# Patient Record
Sex: Female | Born: 1985 | Race: White | Hispanic: No | Smoking: Current every day smoker
Health system: Southern US, Community
[De-identification: ages and names within clinical notes are randomized; demographics above are authoritative.]

## PROBLEM LIST (undated history)

## (undated) DIAGNOSIS — I10 Essential (primary) hypertension: Secondary | ICD-10-CM

## (undated) DIAGNOSIS — L719 Rosacea, unspecified: Secondary | ICD-10-CM

## (undated) DIAGNOSIS — M419 Scoliosis, unspecified: Secondary | ICD-10-CM

## (undated) DIAGNOSIS — F329 Major depressive disorder, single episode, unspecified: Secondary | ICD-10-CM

## (undated) DIAGNOSIS — K210 Gastro-esophageal reflux disease with esophagitis: Secondary | ICD-10-CM

## (undated) DIAGNOSIS — F32A Depression, unspecified: Secondary | ICD-10-CM

## (undated) DIAGNOSIS — A048 Other specified bacterial intestinal infections: Secondary | ICD-10-CM

## (undated) DIAGNOSIS — F419 Anxiety disorder, unspecified: Secondary | ICD-10-CM

## (undated) HISTORY — DX: Other specified bacterial intestinal infections: A04.8

## (undated) HISTORY — DX: Gastro-esophageal reflux disease with esophagitis: K21.0

## (undated) HISTORY — DX: Scoliosis, unspecified: M41.9

## (undated) HISTORY — DX: Rosacea, unspecified: L71.9

## (undated) HISTORY — DX: Essential (primary) hypertension: I10

## (undated) HISTORY — DX: Anxiety disorder, unspecified: F41.9

## (undated) HISTORY — DX: Depression, unspecified: F32.A

## (undated) HISTORY — DX: Major depressive disorder, single episode, unspecified: F32.9

---

## 2004-09-06 DIAGNOSIS — K2100 Gastro-esophageal reflux disease with esophagitis, without bleeding: Secondary | ICD-10-CM

## 2004-09-06 HISTORY — DX: Gastro-esophageal reflux disease with esophagitis, without bleeding: K21.00

## 2007-11-08 DIAGNOSIS — I10 Essential (primary) hypertension: Secondary | ICD-10-CM

## 2007-11-08 HISTORY — DX: Essential (primary) hypertension: I10

## 2008-01-05 ENCOUNTER — Emergency Department (HOSPITAL_COMMUNITY): Admission: EM | Admit: 2008-01-05 | Discharge: 2008-01-05 | Payer: Self-pay | Admitting: Emergency Medicine

## 2008-04-21 ENCOUNTER — Emergency Department (HOSPITAL_COMMUNITY): Admission: EM | Admit: 2008-04-21 | Discharge: 2008-04-21 | Payer: Self-pay | Admitting: Family Medicine

## 2008-05-31 ENCOUNTER — Emergency Department (HOSPITAL_COMMUNITY): Admission: EM | Admit: 2008-05-31 | Discharge: 2008-05-31 | Payer: Self-pay | Admitting: Emergency Medicine

## 2008-06-09 ENCOUNTER — Ambulatory Visit: Payer: Self-pay | Admitting: Internal Medicine

## 2008-06-09 LAB — CONVERTED CEMR LAB
Basophils Absolute: 0 10*3/uL (ref 0.0–0.1)
CO2: 29 meq/L (ref 19–32)
Chloride: 109 meq/L (ref 96–112)
Glucose, Bld: 94 mg/dL (ref 70–99)
Hemoglobin: 13.7 g/dL (ref 12.0–15.0)
Lymphocytes Relative: 30 % (ref 12.0–46.0)
MCHC: 35 g/dL (ref 30.0–36.0)
Monocytes Relative: 2.6 % — ABNORMAL LOW (ref 3.0–12.0)
Neutrophils Relative %: 66.2 % (ref 43.0–77.0)
Platelets: 224 10*3/uL (ref 150–400)
Potassium: 4.6 meq/L (ref 3.5–5.1)
RDW: 11.7 % (ref 11.5–14.6)
Sodium: 141 meq/L (ref 135–145)
TSH: 2.2 microintl units/mL (ref 0.35–5.50)

## 2008-06-24 ENCOUNTER — Encounter: Payer: Self-pay | Admitting: Internal Medicine

## 2008-06-24 ENCOUNTER — Ambulatory Visit: Payer: Self-pay

## 2008-06-24 ENCOUNTER — Ambulatory Visit: Payer: Self-pay | Admitting: Internal Medicine

## 2009-02-17 ENCOUNTER — Ambulatory Visit: Payer: Self-pay | Admitting: Family Medicine

## 2009-02-17 DIAGNOSIS — R05 Cough: Secondary | ICD-10-CM | POA: Insufficient documentation

## 2009-07-11 ENCOUNTER — Telehealth (INDEPENDENT_AMBULATORY_CARE_PROVIDER_SITE_OTHER): Payer: Self-pay | Admitting: *Deleted

## 2010-01-16 ENCOUNTER — Inpatient Hospital Stay (HOSPITAL_COMMUNITY): Admission: AD | Admit: 2010-01-16 | Discharge: 2010-01-19 | Payer: Self-pay | Admitting: Obstetrics and Gynecology

## 2010-03-14 ENCOUNTER — Encounter: Payer: Self-pay | Admitting: Family Medicine

## 2010-11-06 NOTE — Miscellaneous (Signed)
Summary: PATIENT SUMMARY  Clinical Lists Changes      I've never met patient. apparently receives xanax, but never prescribed by [facility].

## 2010-11-28 ENCOUNTER — Encounter: Payer: Self-pay | Admitting: *Deleted

## 2010-12-26 LAB — CBC
HCT: 31.3 % — ABNORMAL LOW (ref 36.0–46.0)
HCT: 33.9 % — ABNORMAL LOW (ref 36.0–46.0)
Hemoglobin: 10.6 g/dL — ABNORMAL LOW (ref 12.0–15.0)
MCHC: 34 g/dL (ref 30.0–36.0)
MCV: 90.3 fL (ref 78.0–100.0)
RBC: 3.43 MIL/uL — ABNORMAL LOW (ref 3.87–5.11)
RBC: 3.75 MIL/uL — ABNORMAL LOW (ref 3.87–5.11)
RDW: 14.6 % (ref 11.5–15.5)
WBC: 8.3 10*3/uL (ref 4.0–10.5)

## 2011-02-19 NOTE — Assessment & Plan Note (Signed)
[facility]                            CARDIOLOGY OFFICE NOTE   JANAVIA, ROTTMAN                        MRN:          [id number]  DATE:06/09/2008                            DOB:          August 28, 1986    IDENTIFICATION:  [name] is a 25 year old woman who was referred for  evaluation of rapid heartbeat.   HISTORY OF PRESENT ILLNESS:  The patient has had a history of  palpitations over the past year.  She notes it can occur with and  without activity, but worse with activity.  She will get a little dizzy  at times, but no syncope.  She has been noted in physical exams to have  rapid heartbeat.  She was seen at [facility] and sent  here for further evaluation.  Again, they appeared to have occurred  earlier this year.  Blood work was reportedly done, though I do not have  the results on this.  EKG was also done.  TSH was reportedly done that  was normal.  She was told to avoid caffeine, trial of Xanax was given.  The patient has noted continuing symptoms.   The patient also has a history of hypertension that was diagnosed  earlier this year.  She is now on verapamil 120, still with symptoms.  Note, blood pressure also noted to be mildly elevated on outpatient  studies.  Her blood pressure has been up in the past even before  starting oral contraceptives by her report.   ALLERGIES:  No known drug allergies.   MEDICATIONS:  1. Verapamil 120.  2. Amitriptyline 50 nightly.  3. Spiriva daily.  4. Xanax 0.25 p.r.n.  5. Multivitamin.   PAST MEDICAL HISTORY:  1. Hypertension.  2. Palpitations.   SURGICAL HISTORY:  Negative.   FAMILY HISTORY:  Mother with a history of anxiety, also history of  hypertension runs in her family.  No premature CAD.   SOCIAL HISTORY:  The patient is married, smokes 4 cigarettes per day,  does not drink alcohol.   REVIEW OF SYSTEMS:  Bitemporal headaches, occasional ankle swelling.  Otherwise all  systems reviewed negative to the above problem except as  noted above.   PHYSICAL EXAM:  GENERAL:  The patient is in no distress.  VITAL SIGNS:  On arrival, blood pressure 141/95, pulse is 105.  Lying  blood pressure 122/83, pulse 86, sitting 131/88, pulse 93, standing  148/94, pulse 92, at 2 minutes 140/88, pulse 99, at 5 minutes 143/84,  pulse 97.  The patient asymptomatic throughout.  HEENT:  Normocephalic and atraumatic.  EOMI.  PERRL.  Mucous membranes  are moist.  NECK:  JVP is normal.  No thyromegaly or bruits.  LUNGS:  Clear to auscultation throughout.  No rales.  No wheezes.  CARDIAC:  Regular rate and rhythm.  S1 and S2.  No S3, S4, or murmurs.  ABDOMEN:  Supple, nontender.  No hepatomegaly.  EXTREMITIES:  Good distal pulses, equal onset, no lower extremity edema.   A 12-lead EKG shows normal sinus tachycardia, rate at 105 beats per  minute.  IMPRESSION:  [name] is a 25 year old with palpitations.  They do not  sound too hemodynamically disturbing.  She was recommended to cut out  caffeine.  She is currently on verapamil and she is still having  symptoms.   1. I would recommend checking a TSH.  Again, we could not get that      from the other office, also a CBC and a BMET.  Also set her up for      Holter to evaluate her 24-hour rate control.  2. Hypertension, not optimally controlled today.  With her desire      within the next year or 2 of getting pregnant, I think switching      her over to a drug that could be used throughout pregnancy would be      good.  She is running out of the verapamil, yesterday was her last      dose.  I would recommend starting labetalol 200 b.i.d.  3. Health care maintenance.  I have counseled her extensively on      smoking.  I think this can exacerbate the problem plus with her on      oral contraceptives, I do not think it is good idea with the      increased risk of clotting again in the setting particularly of       hypertension.   I will be in touch with the patient's physician.  With the use of  Mircette, again there is increased risk for hypertension, question if  she could use another form of contraception.  She said, however, that  her blood pressure was high even before starting the contraceptive.  I  will look to verify.   I will set to see the patient back in a few weeks.  Again, I will be in  touch with the test results.     Pricilla Riffle, MD, [facility]  Electronically Signed    PVR/MedQ  DD: 06/09/2008  DT: 06/10/2008  Job #: [id number]

## 2011-02-22 NOTE — Letter (Signed)
June 27, 2008    Verdie Drown, NP  [address]  [place], [place] [address]   RE:  Suzanne Black, Suzanne Black  MRN:  [id number]  /  DOB:  12/04/1985   Dear Ms. Slatosky:   I appreciate the referral.  I saw her back on June 09, 2008, and I  have enclosed my clinic note with this letter.   When I saw her, I set her up for blood work, Holter monitor.  These have  come back and are unremarkable.  Echocardiogram was also unremarkable.   When I saw her, blood pressure was not optimally controlled.  I switched  her to a beta-blocker with her thoughts of potential pregnancy  (labetalol 200 b.i.d.).  I have set to see her back in the clinic in a  couple of weeks.   My concern is her use of contraceptives, Suzanne Black, as this may be  exacerbating her blood pressure.  I did not take her off it, but told  her I would be talking to you.  Also, of concern to me is the fact that  she does smoke (though only 4 cigarettes per day).  This is increasing  her risk for thrombotic event.   I appreciate your input.  I will also keep you up-to-date with her  clinic returns.   Thank you again for allowing me to participate in her care.  You can  contact me at my beeper [phone] or through our office [phone].    Sincerely,      Pricilla Riffle, MD, [facility]  Electronically Signed    PVR/MedQ  DD: 06/28/2008  DT: 06/29/2008  Job #: [id number]

## 2011-02-28 ENCOUNTER — Ambulatory Visit (INDEPENDENT_AMBULATORY_CARE_PROVIDER_SITE_OTHER): Payer: 59

## 2011-02-28 ENCOUNTER — Inpatient Hospital Stay (INDEPENDENT_AMBULATORY_CARE_PROVIDER_SITE_OTHER)
Admission: RE | Admit: 2011-02-28 | Discharge: 2011-02-28 | Disposition: A | Discharge status: Home / Self Care | Payer: 59 | Source: Ambulatory Visit | Attending: Family Medicine | Admitting: Family Medicine

## 2011-02-28 DIAGNOSIS — J40 Bronchitis, not specified as acute or chronic: Secondary | ICD-10-CM

## 2011-03-15 ENCOUNTER — Ambulatory Visit: Payer: 59 | Admitting: Family Medicine

## 2011-07-01 LAB — URINALYSIS, ROUTINE W REFLEX MICROSCOPIC
Glucose, UA: NEGATIVE
Ketones, ur: NEGATIVE
Leukocytes, UA: NEGATIVE
Nitrite: NEGATIVE
Specific Gravity, Urine: 1.026
pH: 6

## 2011-07-01 LAB — CBC
Hemoglobin: 14.2
MCHC: 34.2
MCV: 86.4
RBC: 4.81
RDW: 12.6

## 2011-07-01 LAB — COMPREHENSIVE METABOLIC PANEL
ALT: 9
CO2: 22
Calcium: 9.3
Creatinine, Ser: 1.17
GFR calc Af Amer: >60
GFR calc non Af Amer: 58 — ABNORMAL LOW
Glucose, Bld: 112 — ABNORMAL HIGH
Sodium: 139
Total Protein: 6.8

## 2011-07-01 LAB — URINE MICROSCOPIC-ADD ON

## 2011-07-01 LAB — DIFFERENTIAL
Lymphocytes Relative: 45
Lymphs Abs: 3.4
Monocytes Relative: 6
Neutro Abs: 3.6
Neutrophils Relative %: 47

## 2011-07-01 LAB — LIPASE, BLOOD: Lipase: 30

## 2011-07-05 LAB — POCT URINALYSIS DIP (DEVICE)
Bilirubin Urine: NEGATIVE
Glucose, UA: NEGATIVE
Hgb urine dipstick: NEGATIVE
Ketones, ur: NEGATIVE
Nitrite: POSITIVE — AB

## 2011-07-05 LAB — URINE CULTURE: Colony Count: >=100000

## 2011-07-05 LAB — POCT PREGNANCY, URINE: Preg Test, Ur: NEGATIVE

## 2011-07-12 ENCOUNTER — Other Ambulatory Visit: Payer: Self-pay | Admitting: Family Medicine

## 2011-07-12 ENCOUNTER — Ambulatory Visit (INDEPENDENT_AMBULATORY_CARE_PROVIDER_SITE_OTHER): Payer: 59 | Admitting: Family Medicine

## 2011-07-12 ENCOUNTER — Encounter: Payer: Self-pay | Admitting: Family Medicine

## 2011-07-12 VITALS — BP 118/88 | HR 80 | Temp 98.2°F | Wt 176.0 lb

## 2011-07-12 DIAGNOSIS — R12 Heartburn: Secondary | ICD-10-CM

## 2011-07-12 MED ORDER — OMEPRAZOLE 40 MG PO CPDR: 40.0000 mg | DELAYED_RELEASE_CAPSULE | Freq: Every day | ORAL | Status: DC

## 2011-07-15 ENCOUNTER — Encounter: Payer: Self-pay | Admitting: Family Medicine

## 2011-07-15 NOTE — Progress Notes (Signed)
Suzanne Black has had heartburn, and nausea and occasional vomiting for 2 weeks. She notes that these symptoms are similar to H. Pylori that he had as a teenager. She notes some pain in epigastric region following eating. She has tried intermittent H2 blockers but this does not help much. She denies any diarrhea or URQ pain. No fever or chills. No melena or blood per rectum.   PMH reviewed.  ROS as above otherwise neg Medications reviewed.  Exam:  BP 118/88  Pulse 80  Temp(Src) 98.2 F (36.8 C) (Oral)  Wt 176 lb (79.833 kg)  LMP 06/28/2011 Gen: Well NAD Lungs: CTABL Nl WOB Heart: RRR no MRG Abd: NABS, NT, ND. Neg Murphy sign and no masses palpated.  Exts: Non edematous BL  LE, warm and well perfused.

## 2011-07-15 NOTE — Assessment & Plan Note (Signed)
Not sure of etiology however reflux is most likely.  As she has a past history of H. Pylori I cannot rely on an antibody blood test. She will be able to do a Urease Breath Test today for conformation.  Will also start on Omeprazole 40mg  daily x 1 month. Will follow up in my clinic.  If breath test positive I will use quadruple therapy. If negative will follow up and obtain blood and likely order US abdomen.

## 2011-07-17 ENCOUNTER — Telehealth: Payer: Self-pay | Admitting: Family Medicine

## 2011-07-17 NOTE — Telephone Encounter (Signed)
Pt returned a miss call and is not sure who it was from.  It came from this practice.

## 2011-07-17 NOTE — Telephone Encounter (Signed)
Pt advised U/S sched for Friday at 8:00 at [facility]. Pt advised NPO after midnight. Pt sched BW for 8:45 Friday after U/S.

## 2011-07-17 NOTE — Telephone Encounter (Signed)
Pt is asking to know what to do about her [place] appt- where does she go? Any prep for this?

## 2011-07-17 NOTE — Telephone Encounter (Signed)
Called to discuss test results. Omeprazole has not helped Plan to place order for Sheliah to walk in for a CMP and CBC. We'll also order abdominal ultrasound.

## 2011-07-18 ENCOUNTER — Telehealth: Payer: Self-pay | Admitting: Family Medicine

## 2011-07-18 ENCOUNTER — Ambulatory Visit (INDEPENDENT_AMBULATORY_CARE_PROVIDER_SITE_OTHER): Payer: 59 | Admitting: Family Medicine

## 2011-07-18 VITALS — BP 131/82 | HR 90 | Temp 98.5°F | Ht 64.75 in

## 2011-07-18 DIAGNOSIS — R1013 Epigastric pain: Secondary | ICD-10-CM

## 2011-07-18 DIAGNOSIS — R12 Heartburn: Secondary | ICD-10-CM

## 2011-07-18 DIAGNOSIS — R109 Unspecified abdominal pain: Secondary | ICD-10-CM

## 2011-07-18 LAB — COMPREHENSIVE METABOLIC PANEL
ALT: <8 U/L (ref 0–35)
Alkaline Phosphatase: 85 U/L (ref 39–117)
CO2: 20 mEq/L (ref 19–32)
Creat: 0.98 mg/dL (ref 0.50–1.10)
Glucose, Bld: 85 mg/dL (ref 70–99)
Total Bilirubin: 0.3 mg/dL (ref 0.3–1.2)

## 2011-07-18 LAB — CBC
HCT: 41 % (ref 36.0–46.0)
MCV: 86.1 fL (ref 78.0–100.0)
RBC: 4.76 MIL/uL (ref 3.87–5.11)
WBC: 6.8 10*3/uL (ref 4.0–10.5)

## 2011-07-18 MED ORDER — ESOMEPRAZOLE MAGNESIUM 40 MG PO CPDR: 40.0000 mg | DELAYED_RELEASE_CAPSULE | Freq: Every day | ORAL | Status: DC

## 2011-07-18 NOTE — Telephone Encounter (Signed)
There is now a hard place on her belly and is really swollen.  Got sick on her stomach and wonders if she should have something done today.

## 2011-07-18 NOTE — Telephone Encounter (Signed)
Spoke with patient and advised her to come in now and we would work her in with Dr Denyse Amass.Yohan Samons, Rodena Medin

## 2011-07-18 NOTE — Progress Notes (Signed)
  Subjective:    Patient ID: Suzanne Black, female    DOB: 1986-09-04, 25 y.o.   MRN: [id number]  HPI 1.  Abdominal pain:  Comes in today with continue abdominal pain.  Has been ongoing for 2-3 weeks.  Has seen Dr. Denyse Amass recently for this and started on PPI.  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  She has been having black-tarry bowel movements but this is not with every bowel movement.  She often feels nauseated after eating as well.  She denies BRBPR.  She has felt more tired lately, but denies dizziness or palpitations.  She denies fever, chills, hematemesis.   Of note she does have an ultrasound scheduled for tomorrow morning ordered by Dr. Denyse Amass.     Review of Systems     Objective:   Physical Exam  Constitutional: She appears well-developed and well-nourished. No distress.  HENT:  Head: Normocephalic and atraumatic.  Cardiovascular: Normal rate, regular rhythm and normal heart sounds.   No murmur heard. Pulmonary/Chest: Effort normal and breath sounds normal. No respiratory distress.  Abdominal: Soft. Normal appearance and bowel sounds are normal. There is tenderness in the epigastric area. There is no rebound, no guarding, no tenderness at McBurney's point and negative Murphy's sign.    Genitourinary: Rectum normal. Rectal exam shows no external hemorrhoid, no internal hemorrhoid, no mass and no tenderness. Guaiac negative stool.          Assessment & Plan:

## 2011-07-18 NOTE — Patient Instructions (Addendum)
I think your pain is likely from gastritis vs. Peptic ulcer disease.  Although you didn't have blood in your stool today, it does not mean you do not have an intermittent bleed.  We will go ahead and check your blood count and chemistry panel today.  Go ahead with your ultrasound tomorrow as well.  I would like for you to take your nexium twice per day for the next two weeks.  If you are not improving within the next 1-2 weeks please give [place] a call back.

## 2011-07-19 ENCOUNTER — Ambulatory Visit (HOSPITAL_COMMUNITY)
Admission: RE | Admit: 2011-07-19 | Discharge: 2011-07-19 | Disposition: A | Discharge status: Home / Self Care | Payer: 59 | Source: Ambulatory Visit | Attending: Family Medicine | Admitting: Family Medicine

## 2011-07-19 ENCOUNTER — Other Ambulatory Visit: Payer: 59

## 2011-07-19 DIAGNOSIS — R109 Unspecified abdominal pain: Secondary | ICD-10-CM | POA: Insufficient documentation

## 2011-07-21 DIAGNOSIS — R1013 Epigastric pain: Secondary | ICD-10-CM | POA: Insufficient documentation

## 2011-07-21 NOTE — Assessment & Plan Note (Signed)
Persistent epigastric pain and report of black-tarry stool concerning for ulcer or gastritis.  She is hemeocculst negative today but this does not rule out an intermittent bleed.  Will have her go ahead with U/S tomorrow to assess for any other pathology.  For now will have her double up on her PPI to see if this helps with her symptoms.  If not improving may need referral to GI for EGD.   Will check CMET and CBC today.

## 2011-07-26 ENCOUNTER — Encounter: Payer: Self-pay | Admitting: Family Medicine

## 2011-08-15 ENCOUNTER — Other Ambulatory Visit: Payer: Self-pay | Admitting: Family Medicine

## 2011-08-15 ENCOUNTER — Ambulatory Visit (HOSPITAL_COMMUNITY)
Admission: RE | Admit: 2011-08-15 | Discharge: 2011-08-15 | Disposition: A | Discharge status: Home / Self Care | Payer: 59 | Source: Ambulatory Visit | Attending: Sports Medicine | Admitting: Sports Medicine

## 2011-08-15 ENCOUNTER — Ambulatory Visit (INDEPENDENT_AMBULATORY_CARE_PROVIDER_SITE_OTHER): Payer: 59 | Admitting: Sports Medicine

## 2011-08-15 VITALS — BP 120/80 | Ht 65.0 in | Wt 172.0 lb

## 2011-08-15 DIAGNOSIS — M549 Dorsalgia, unspecified: Secondary | ICD-10-CM | POA: Insufficient documentation

## 2011-08-15 DIAGNOSIS — M25562 Pain in left knee: Secondary | ICD-10-CM | POA: Insufficient documentation

## 2011-08-15 DIAGNOSIS — M2170 Unequal limb length (acquired), unspecified site: Secondary | ICD-10-CM

## 2011-08-15 DIAGNOSIS — M546 Pain in thoracic spine: Secondary | ICD-10-CM

## 2011-08-15 DIAGNOSIS — M4120 Other idiopathic scoliosis, site unspecified: Secondary | ICD-10-CM | POA: Insufficient documentation

## 2011-08-15 DIAGNOSIS — M25569 Pain in unspecified knee: Secondary | ICD-10-CM

## 2011-08-15 DIAGNOSIS — M25561 Pain in right knee: Secondary | ICD-10-CM

## 2011-08-15 MED ORDER — TRAMADOL HCL 50 MG PO TABS: 50.0000 mg | ORAL_TABLET | Freq: Four times a day (QID) | ORAL | Status: DC | PRN

## 2011-08-15 NOTE — Progress Notes (Signed)
  Subjective:    Patient ID: Suzanne Black, female    DOB: 11/21/1985, 25 y.o.   MRN: [id number]  HPI 25 yo [facility]. She complains of worsening bilateral knee pain x 4 days following a long day of standing on her feet at work. She has a history of low back, hip and knee pain for which she was evaluated in 2005 to rule out autoimmune arthritis. Her work-up was negative. At baseline she has 3-4/10 pain in her low back and knees. She now has 6-7/10 pain in her knees that radiates down into her into legs and calves. She feels that her knees are swollen R>L.   Physical activity: on her feet at work. Averages 2-3 days of exercise (walk/run on treadmill for 1-1.5 miles).   Fam Hx: negative for arthritis/ rheumatological disease  Review of Systems She denies fever, chills, weight loss. She admits to fatigue but is busy with a 107 month old daughter.     Objective:   Physical Exam Gen: NAD, overweight female.  Back: thoracic kyphosis. Thoracic and lumbar pain with lateral flexion and extension. No spinal tenderness.   Bilateral Knee: Normal to inspection with no erythema or effusion or obvious bony abnormalities. Palpation normal with no warmth or joint line tenderness or patellar tenderness or condyle tenderness. ROM normal in flexion and extension and lower leg rotation. Ligaments with solid consistent endpoints including ACL, PCL, LCL, MCL. Negative Mcmurray's and provocative meniscal tests. Non painful patellar compression. Patellar and quadriceps tendons unremarkable. Hamstring and quadriceps strength is normal.  Pelvis/Hips: Non tender. Full ROM. Negative log roll. Negative Faber's bilaterally. Negative log roll bilaterally.  Lower extremities: LLE 1.5 cm longer than RLE. Negative Lachman test bilaterally.  Gait/Stance: Genu valgus on the Left. Bilateral ankle laxity.     Assessment & Plan:

## 2011-08-15 NOTE — Assessment & Plan Note (Addendum)
A: Pain secondary to exaggerated lordosis. Lordosis is concerning for osteopenia vs. Scheuermann's disease.  P: Obtain 2 view thoracic radiographs to evaluate thoracic vertebrae. Continue ibuprofen prn. F/u pending review of x-rays.   X-rays reveal that she does have increased lordosis but also has a degrees of thoracic scoliosis. I suspect this trigger some symptoms from nerve retraction and muscle spasm. We will give her exercises to emphasize lessening the thoracic lordosis and for maintaining good posture. We will recheck this in 6 weeks

## 2011-08-15 NOTE — Assessment & Plan Note (Signed)
I think most of his symptoms in the knees are triggered by standing in her leg length inequality. This does change her gait so we will make additional adjustments to her shoes and inserts.

## 2011-08-15 NOTE — Patient Instructions (Addendum)
  Please do the postural exercises and the upper back strengthening exercises on a daily basis.  You are given some exercises for her knee and hamstring strength to  Try the lift in her shoe if this works well we will to add list to your other shoes.  We should recheck you in about 6 weeks.

## 2011-08-15 NOTE — Assessment & Plan Note (Signed)
A: low back and bilateral knee pain secondary to compensation for unequal leg length.  P: correct length discrepancy with orthotic inserts. Patient to f/u after obtaining thoracic x-ray for initiation of basic exercise regimen for midback and quad/hamstring strengthening.

## 2012-01-11 ENCOUNTER — Emergency Department
Admission: EM | Admit: 2012-01-11 | Discharge: 2012-01-11 | Disposition: A | Discharge status: Home / Self Care | Payer: 59 | Source: Home / Self Care | Attending: Family Medicine | Admitting: Family Medicine

## 2012-01-11 DIAGNOSIS — J029 Acute pharyngitis, unspecified: Secondary | ICD-10-CM

## 2012-01-11 HISTORY — DX: Essential (primary) hypertension: I10

## 2012-01-11 NOTE — Discharge Instructions (Signed)
Pharyngitis, Viral and Bacterial Pharyngitis is soreness (inflammation) or infection of the pharynx. It is also called a sore throat. CAUSES  Most sore throats are caused by viruses and are part of a cold. However, some sore throats are caused by strep and other bacteria. Sore throats can also be caused by post nasal drip from draining sinuses, allergies and sometimes from sleeping with an open mouth. Infectious sore throats can be spread from person to person by coughing, sneezing and sharing cups or eating utensils. TREATMENT  Sore throats that are viral usually last 3-4 days. Viral illness will get better without medications (antibiotics). Strep throat and other bacterial infections will usually begin to get better about 24-48 hours after you begin to take antibiotics. HOME CARE INSTRUCTIONS   If the caregiver feels there is a bacterial infection or if there is a positive strep test, they will prescribe an antibiotic. The full course of antibiotics must be taken. If the full course of antibiotic is not taken, you or your child may become ill again. If you or your child has strep throat and do not finish all of the medication, serious heart or kidney diseases may develop.   Drink enough water and fluids to keep your urine clear or pale yellow.   Only take over-the-counter or prescription medicines for pain, discomfort or fever as directed by your caregiver.   Get lots of rest.   Gargle with salt water ( tsp. of salt in a glass of water) as often as every 1-2 hours as you need for comfort.   Hard candies may soothe the throat if individual is not at risk for choking. Throat sprays or lozenges may also be used.  SEEK MEDICAL CARE IF:   Large, tender lumps in the neck develop.   A rash develops.   Green, yellow-brown or bloody sputum is coughed up.   Your baby is older than 3 months with a rectal temperature of 100.5 F (38.1 C) or higher for more than 1 day.  SEEK IMMEDIATE MEDICAL CARE  IF:   A stiff neck develops.   You or your child are drooling or unable to swallow liquids.   You or your child are vomiting, unable to keep medications or liquids down.   You or your child has severe pain, unrelieved with recommended medications.   You or your child are having difficulty breathing (not due to stuffy nose).   You or your child are unable to fully open your mouth.   You or your child develop redness, swelling, or severe pain anywhere on the neck.   You have a fever.   Your baby is older than 3 months with a rectal temperature of 102 F (38.9 C) or higher.   Your baby is 36 months old or younger with a rectal temperature of 100.4 F (38 C) or higher.  MAKE SURE YOU:   Understand these instructions.   Will watch your condition.   Will get help right away if you are not doing well or get worse.  Document Released: 09/23/2005 Document Revised: 09/12/2011 Document Reviewed: 12/21/2007 [facility] Patient Information 2012 [place], [place].     Rapid Step negative.  Viral pharyngitis likely, increase fluids, tylenol or motrin for discomfort.  OTC Throat spray of your choice.

## 2012-01-11 NOTE — ED Provider Notes (Signed)
History     CSN: [id number]  Arrival date & time 01/11/12  1013   First MD Initiated Contact with Patient 01/11/12 1050      Chief Complaint  Patient presents with  . Sore Throat  . Fatigue    (Consider location/radiation/quality/duration/timing/severity/associated sxs/prior treatment) HPI Comments: See notes by Lannie Fields NP  See notes by Lannie Fields NP Past Medical History  Diagnosis Date  . H. pylori infection   . Hypertension     History reviewed. No pertinent past surgical history.  Family History  Problem Relation Age of Onset  . Hypertension Mother     History  Substance Use Topics  . Smoking status: Current Everyday Smoker -- 0.3 packs/day    Types: Cigarettes  . Smokeless tobacco: Not on file  . Alcohol Use: No    OB History    Grav Para Term Preterm Abortions TAB SAB Ect Mult Living                  Review of Systems See notes by Lannie Fields NP Allergies  Review of patient's allergies indicates no known allergies.  Home Medications   Current Outpatient Rx  Name Route Sig Dispense Refill  . DESOGESTREL-ETHINYL ESTRADIOL 0.15-0.02/0.01 MG (21/5) PO TABS Oral Take 1 tablet by mouth daily.    Marland Kitchen ALPRAZOLAM 0.25 MG PO TABS Oral Take 0.25 mg by mouth as needed.      Marland Kitchen CITALOPRAM HYDROBROMIDE 40 MG PO TABS Oral Take 40 mg by mouth daily.      Marland Kitchen ESOMEPRAZOLE MAGNESIUM 40 MG PO CPDR Oral Take 1 capsule (40 mg total) by mouth daily before breakfast. 60 capsule 2  . FLUTICASONE PROPIONATE 50 MCG/ACT NA SUSP Nasal 2 sprays by Nasal route daily. Per nostril     . LABETALOL HCL 200 MG PO TABS Oral Take 200 mg by mouth 2 (two) times daily.      . TRAMADOL HCL 50 MG PO TABS Oral Take 1 tablet (50 mg total) by mouth every 6 (six) hours as needed for pain. Maximum dose= 8 tablets per day 60 tablet 2    BP 106/72  Pulse 89  Temp(Src) 97.7 F (36.5 C) (Oral)  Resp 18  Ht 5\' 4"  (1.626 m)  Wt 181 lb 8 oz (82.328 kg)  BMI 31.15 kg/m2  SpO2 96%  LMP  01/04/2012  Physical Exam See notes by Lannie Fields Np ED Course  Procedures (including critical care time)   Labs Reviewed  POCT RAPID STREP A (OFFICE)   No results found.   1. Acute pharyngitis   2. Sore throat       MDM  See notes by Lannie Fields, NP        Lattie Haw, MD 01/12/12 [phone]

## 2012-01-11 NOTE — ED Provider Notes (Signed)
History     CSN: [id number]  Arrival date & time 01/11/12  1013   First MD Initiated Contact with Patient 01/11/12 1050      Chief Complaint  Patient presents with  . Sore Throat  . Fatigue    (Consider location/radiation/quality/duration/timing/severity/associated sxs/prior treatment) Patient is a 26 y.o. female presenting with pharyngitis.  Sore Throat  Suzanne Black is a 26 y.o. female who complains of onset of sore throat for 4 days.  + cough, non productive No pleuritic pain No wheezing No nasal congestion + post-nasal drainage + sinus pain/pressure No chest congestion No itchy/red eyes No earache No hemoptysis No SOB No chills/sweats No fever No nausea No vomiting No abdominal pain No diarrhea No skin rashes No fatigue No myalgias + headache  Daughter diagnosed and treated with antibiotics for strep (on 8th day).    Past Medical History  Diagnosis Date  . H. pylori infection   . Hypertension     History reviewed. No pertinent past surgical history.  Family History  Problem Relation Age of Onset  . Hypertension Mother     History  Substance Use Topics  . Smoking status: Current Everyday Smoker -- 0.3 packs/day    Types: Cigarettes  . Smokeless tobacco: Not on file  . Alcohol Use: No    OB History    Grav Para Term Preterm Abortions TAB SAB Ect Mult Living                  Review of Systems  All other systems reviewed and are negative.    Allergies  Review of patient's allergies indicates no known allergies.  Home Medications   Current Outpatient Rx  Name Route Sig Dispense Refill  . DESOGESTREL-ETHINYL ESTRADIOL 0.15-0.02/0.01 MG (21/5) PO TABS Oral Take 1 tablet by mouth daily.    Marland Kitchen ALPRAZOLAM 0.25 MG PO TABS Oral Take 0.25 mg by mouth as needed.      Marland Kitchen CITALOPRAM HYDROBROMIDE 40 MG PO TABS Oral Take 40 mg by mouth daily.      Marland Kitchen ESOMEPRAZOLE MAGNESIUM 40 MG PO CPDR Oral Take 1 capsule (40 mg total) by mouth daily before breakfast. 60  capsule 2  . FLUTICASONE PROPIONATE 50 MCG/ACT NA SUSP Nasal 2 sprays by Nasal route daily. Per nostril     . LABETALOL HCL 200 MG PO TABS Oral Take 200 mg by mouth 2 (two) times daily.      . TRAMADOL HCL 50 MG PO TABS Oral Take 1 tablet (50 mg total) by mouth every 6 (six) hours as needed for pain. Maximum dose= 8 tablets per day 60 tablet 2    BP 106/72  Pulse 89  Temp(Src) 97.7 F (36.5 C) (Oral)  Resp 18  Ht 5\' 4"  (1.626 m)  Wt 181 lb 8 oz (82.328 kg)  BMI 31.15 kg/m2  SpO2 96%  LMP 01/04/2012  Physical Exam  Constitutional: She is oriented to person, place, and time. Vital signs are normal. She appears well-developed and well-nourished. She is active and cooperative.  HENT:  Head: Normocephalic.  Right Ear: Hearing, tympanic membrane, external ear and ear canal normal.  Left Ear: Hearing, tympanic membrane, external ear and ear canal normal.  Nose: Nose normal.  Mouth/Throat: Uvula is midline and mucous membranes are normal. Posterior oropharyngeal erythema present.  Eyes: Conjunctivae are normal. Pupils are equal, round, and reactive to light. No scleral icterus.  Neck: Trachea normal. Neck supple.  Cardiovascular: Normal rate and regular rhythm.   Pulmonary/Chest: Effort  normal and breath sounds normal.  Lymphadenopathy:    She has cervical adenopathy.       Left cervical: Posterior cervical adenopathy present.  Neurological: She is alert and oriented to person, place, and time. No cranial nerve deficit or sensory deficit.  Skin: Skin is warm and dry.  Psychiatric: She has a normal mood and affect. Her speech is normal and behavior is normal. Judgment and thought content normal. Cognition and memory are normal.    ED Course  Procedures (including critical care time)   Labs Reviewed  POCT RAPID STREP A (OFFICE)   No results found.   1. Acute pharyngitis   2. Sore throat       MDM  OTC throat spray of your choice, tylenol or motrin for discomfort.  Increase  fluids, get plenty of rest.  Likely viral infection.  Strep test is negative       Suzanne Kindred, NP 01/11/12 1120

## 2012-01-11 NOTE — ED Notes (Signed)
Daughter with strep last week, mom states her throat has been sore x 1 week

## 2012-01-12 NOTE — ED Provider Notes (Signed)
Agree with exam, assessment, and plan.   Lattie Haw, MD 01/12/12 1137

## 2012-01-14 ENCOUNTER — Ambulatory Visit (INDEPENDENT_AMBULATORY_CARE_PROVIDER_SITE_OTHER): Payer: 59 | Admitting: Physician Assistant

## 2012-01-14 ENCOUNTER — Encounter: Payer: Self-pay | Admitting: Physician Assistant

## 2012-01-14 VITALS — BP 109/82 | HR 83 | Temp 97.9°F | Resp 14 | Ht 64.0 in | Wt 177.0 lb

## 2012-01-14 DIAGNOSIS — F419 Anxiety disorder, unspecified: Secondary | ICD-10-CM

## 2012-01-14 DIAGNOSIS — Z0000 Encounter for general adult medical examination without abnormal findings: Secondary | ICD-10-CM

## 2012-01-14 DIAGNOSIS — F341 Dysthymic disorder: Secondary | ICD-10-CM

## 2012-01-14 DIAGNOSIS — I10 Essential (primary) hypertension: Secondary | ICD-10-CM

## 2012-01-14 MED ORDER — CITALOPRAM HYDROBROMIDE 40 MG PO TABS: 40.0000 mg | ORAL_TABLET | Freq: Every day | ORAL | Status: DC

## 2012-01-14 MED ORDER — ZOLPIDEM TARTRATE 10 MG PO TABS: 10.0000 mg | ORAL_TABLET | Freq: Every evening | ORAL | Status: DC | PRN

## 2012-01-14 MED ORDER — ALPRAZOLAM 0.25 MG PO TABS: 0.2500 mg | ORAL_TABLET | ORAL | Status: DC | PRN

## 2012-01-14 NOTE — Patient Instructions (Signed)
Reduce the Labetalol to one-half tablet twice daily and continue to monitor your blood pressure twice weekly for the next 3-4 weeks.  If your blood pressure remains under 120/80, discontinue the Labetalol and continue to monitor (restart the Labetalol at 100 mg BID if it rises above 120/80 consistently).  If, when you reduce the dose, your pressure rises above 120/80, resume the 200 mg BID dosing.

## 2012-01-14 NOTE — Progress Notes (Signed)
  Subjective:    Patient ID: Suzanne Black, female    DOB: 01/13/1986, 26 y.o.   MRN: [id number]  HPI Presents to establish for primary care and for wellness exam.  Breast and pap performed by OBGYN annually.  PMH: HTN, obesity, anxiety/depression.  Would like to D/C the Labetalol.  BP has been well controlled for 2 years since the birth of her daughter.  She is able to check her BP outside of the office.  She is working on smoking cessation-has been successful previously.  She is employed as a Associate Professor at [employer].   Review of Systems  Constitutional: Negative.   HENT: Negative.   Eyes: Negative.   Respiratory: Negative.   Cardiovascular: Negative.   Gastrointestinal: Negative.   Genitourinary: Negative.   Musculoskeletal: Negative.   Skin: Negative.   Neurological: Negative.   Hematological: Negative.   Psychiatric/Behavioral: Negative.        Objective:   Physical Exam Vital signs noted. Well-developed, well nourished WF who is awake, alert and oriented, in NAD. HEENT: [place]/AT, PERRL, EOMI.  Sclera and conjunctiva are clear.  EAC are patent, TMs are normal in appearance. Nasal mucosa is pink and moist. OP is clear. Neck: supple, non-tender, no lymphadenopathey, thyromegaly. Heart: RRR, no murmur Lungs: CTA Neurologic: CN II-XII intact. DTRs symmetrically strong.  Normal strength, sensation. Extremities: no cyanosis, clubbing or edema. Skin: warm and dry without rash.     Assessment & Plan:   1. Routine general medical examination at a health care facility  Lipid panel  2. Essential hypertension, benign  CBC with Differential, Comprehensive metabolic panel, TSH  3. Anxiety and depression  ALPRAZolam (XANAX) 0.25 MG tablet, citalopram (CELEXA) 40 MG tablet, zolpidem (AMBIEN) 10 MG tablet; she may call for refills x 6 months.   She'll reduce the Labetalol to 100 mg BID and monitor.  If BP remains <120/80, she may D/C and see.  If it rises, she'll  resume the higher dose.

## 2012-01-15 LAB — CBC WITH DIFFERENTIAL/PLATELET
Basophils Absolute: 0 10*3/uL (ref 0.0–0.1)
Basophils Relative: 0 % (ref 0–1)
Eosinophils Absolute: 0.1 10*3/uL (ref 0.0–0.7)
MCH: 28.9 pg (ref 26.0–34.0)
MCHC: 32.8 g/dL (ref 30.0–36.0)
Neutro Abs: 4.9 10*3/uL (ref 1.7–7.7)
Neutrophils Relative %: 60 % (ref 43–77)
Platelets: 220 10*3/uL (ref 150–400)
RDW: 13.1 % (ref 11.5–15.5)

## 2012-01-15 LAB — COMPREHENSIVE METABOLIC PANEL
AST: 16 U/L (ref 0–37)
Alkaline Phosphatase: 75 U/L (ref 39–117)
Glucose, Bld: 95 mg/dL (ref 70–99)
Potassium: 4.1 mEq/L (ref 3.5–5.3)
Sodium: 138 mEq/L (ref 135–145)
Total Bilirubin: 0.4 mg/dL (ref 0.3–1.2)
Total Protein: 6.8 g/dL (ref 6.0–8.3)

## 2012-01-15 LAB — LIPID PANEL
Cholesterol: 220 mg/dL — ABNORMAL HIGH (ref 0–200)
HDL: 48 mg/dL (ref >39)
LDL Cholesterol: 153 mg/dL — ABNORMAL HIGH (ref 0–99)
Total CHOL/HDL Ratio: 4.6 Ratio
Triglycerides: 95 mg/dL (ref <150)
VLDL: 19 mg/dL (ref 0–40)

## 2012-01-15 LAB — TSH: TSH: 2.512 u[IU]/mL (ref 0.350–4.500)

## 2012-01-16 ENCOUNTER — Telehealth: Payer: Self-pay

## 2012-01-16 NOTE — Telephone Encounter (Signed)
Chelle, Can you please review labs and send to lab pool. Pt is calling about them. Thanks

## 2012-01-16 NOTE — Telephone Encounter (Signed)
Wanting to know lab results  °

## 2012-01-16 NOTE — Telephone Encounter (Signed)
Please advise the patient that her labs were all normal, except for her LDL cholesterol.  Increasing her exercise, and making healthy eating choices will help, but I also recommend that she start OTC Fish Oil, 2 g daily.  We'll recheck in 6 months.

## 2012-01-17 NOTE — Telephone Encounter (Signed)
Pt CB and gave her message from [place]. Pt agreed

## 2012-01-21 ENCOUNTER — Encounter: Payer: Self-pay | Admitting: Physician Assistant

## 2012-03-19 ENCOUNTER — Encounter: Payer: Self-pay | Admitting: *Deleted

## 2012-03-19 ENCOUNTER — Emergency Department
Admission: EM | Admit: 2012-03-19 | Discharge: 2012-03-19 | Disposition: A | Discharge status: Home / Self Care | Payer: 59 | Source: Home / Self Care | Attending: Emergency Medicine | Admitting: Emergency Medicine

## 2012-03-19 DIAGNOSIS — J069 Acute upper respiratory infection, unspecified: Secondary | ICD-10-CM

## 2012-03-19 DIAGNOSIS — J029 Acute pharyngitis, unspecified: Secondary | ICD-10-CM

## 2012-03-19 MED ORDER — AMOXICILLIN 875 MG PO TABS: 875.0000 mg | ORAL_TABLET | Freq: Two times a day (BID) | ORAL | Status: AC

## 2012-03-19 NOTE — ED Notes (Signed)
Patient c/o sore throat and fever x 4 days

## 2012-03-19 NOTE — ED Provider Notes (Signed)
History     CSN: [id number]  Arrival date & time 03/19/12  0848   First MD Initiated Contact with Patient 03/19/12 0902      Chief Complaint  Patient presents with  . Sore Throat    (Consider location/radiation/quality/duration/timing/severity/associated sxs/prior treatment) HPI Suzanne Black is a 26 y.o. female who complains of onset of cold symptoms for 2 days.  The symptoms are constant and mild-moderate in severity.  Not taking any medicines.  She is a Associate Professor.  + Exposure to strep throat this week. + sore throat No cough No pleuritic pain No wheezing No nasal congestion + post-nasal drainage No sinus pain/pressure No chest congestion No itchy/red eyes No earache No hemoptysis No SOB No chills/sweats + fever No nausea No vomiting No abdominal pain No diarrhea No skin rashes + fatigue + myalgias + headache     Past Medical History  Diagnosis Date  . H. pylori infection   . Hypertension   . Depression   . Anxiety   . Scoliosis   . Reflux esophagitis 09/2004  . Essential hypertension, benign 11/2007  . Rosacea     History reviewed. No pertinent past surgical history.  Family History  Problem Relation Age of Onset  . Hypertension Father   . Hyperlipidemia Father   . Mental illness Maternal Uncle   . Mental illness Mother     anxiety    History  Substance Use Topics  . Smoking status: Current Everyday Smoker -- 0.3 packs/day    Types: Cigarettes  . Smokeless tobacco: Never Used  . Alcohol Use: No    OB History    Grav Para Term Preterm Abortions TAB SAB Ect Mult Living                  Review of Systems  All other systems reviewed and are negative.    Allergies  Review of patient's allergies indicates no known allergies.  Home Medications   Current Outpatient Rx  Name Route Sig Dispense Refill  . ALPRAZOLAM 0.25 MG PO TABS Oral Take 1 tablet (0.25 mg total) by mouth as needed. 30 tablet 0  . AMOXICILLIN 875 MG PO TABS Oral Take  1 tablet (875 mg total) by mouth 2 (two) times daily. 14 tablet 0  . CITALOPRAM HYDROBROMIDE 40 MG PO TABS Oral Take 1 tablet (40 mg total) by mouth daily. 90 tablet 3  . DESOGESTREL-ETHINYL ESTRADIOL 0.15-0.02/0.01 MG (21/5) PO TABS Oral Take 1 tablet by mouth daily.    Marland Kitchen LABETALOL HCL 200 MG PO TABS Oral Take 200 mg by mouth 2 (two) times daily.      Marland Kitchen ZOLPIDEM TARTRATE 10 MG PO TABS Oral Take 1 tablet (10 mg total) by mouth at bedtime as needed. 30 tablet 0    BP 137/85  Pulse 123  Temp 99.3 F (37.4 C) (Oral)  Resp 14  Ht 5\' 4"  (1.626 m)  Wt 182 lb (82.555 kg)  BMI 31.24 kg/m2  SpO2 97%  LMP 03/05/2012  Physical Exam  Nursing note and vitals reviewed. Constitutional: She is oriented to person, place, and time. She appears well-developed and well-nourished.  HENT:  Head: Normocephalic and atraumatic.  Right Ear: Tympanic membrane, external ear and ear canal normal.  Left Ear: Tympanic membrane, external ear and ear canal normal.  Nose: Nose normal.  Mouth/Throat: Posterior oropharyngeal erythema present. No oropharyngeal exudate or posterior oropharyngeal edema.  Eyes: No scleral icterus.  Neck: Neck supple.       Mildly  tender L sided ant cerv LAD  Cardiovascular: Regular rhythm and normal heart sounds.   Pulmonary/Chest: Effort normal and breath sounds normal. No respiratory distress. She has no decreased breath sounds. She has no wheezes.  Neurological: She is alert and oriented to person, place, and time.  Skin: Skin is warm and dry.  Psychiatric: She has a normal mood and affect. Her speech is normal.    ED Course  Procedures (including critical care time)   Labs Reviewed  POCT RAPID STREP A (OFFICE)   No results found.   1. Acute upper respiratory infections of unspecified site   2. Acute pharyngitis       MDM  1)  Take the prescribed antibiotic as instructed.  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  I advised her that she is contagious for 24 hours as needed to change her toothbrush after that period. 2)  Use nasal saline solution (over the counter) at least 3 times a day. 3)  Use over the counter decongestants like Zyrtec-D every 12 hours as needed to help with congestion.  If you have hypertension, do not take medicines with sudafed.  4)  Can take tylenol every 6 hours or motrin every 8 hours for pain or fever. 5)  Follow up with your primary doctor if no improvement in 5-7 days, sooner if increasing pain, fever, or new symptoms.      Marlaine Hind, MD 03/19/12 [phone]

## 2012-03-23 ENCOUNTER — Other Ambulatory Visit: Payer: Self-pay | Admitting: Physician Assistant

## 2012-05-12 ENCOUNTER — Other Ambulatory Visit: Payer: Self-pay | Admitting: Physician Assistant

## 2012-05-25 ENCOUNTER — Other Ambulatory Visit: Payer: Self-pay | Admitting: Physician Assistant

## 2012-06-25 ENCOUNTER — Other Ambulatory Visit: Payer: Self-pay | Admitting: Physician Assistant

## 2012-07-06 ENCOUNTER — Other Ambulatory Visit: Payer: Self-pay | Admitting: Physician Assistant

## 2012-07-14 ENCOUNTER — Ambulatory Visit: Payer: 59 | Admitting: Physician Assistant

## 2012-07-22 ENCOUNTER — Other Ambulatory Visit: Payer: Self-pay | Admitting: Physician Assistant

## 2012-07-23 ENCOUNTER — Ambulatory Visit: Payer: 59 | Admitting: Physician Assistant

## 2012-07-24 ENCOUNTER — Other Ambulatory Visit: Payer: Self-pay | Admitting: Physician Assistant

## 2012-08-07 ENCOUNTER — Other Ambulatory Visit: Payer: Self-pay | Admitting: Physician Assistant

## 2012-08-25 ENCOUNTER — Encounter: Payer: Self-pay | Admitting: Physician Assistant

## 2012-08-25 ENCOUNTER — Ambulatory Visit (INDEPENDENT_AMBULATORY_CARE_PROVIDER_SITE_OTHER): Payer: 59 | Admitting: Physician Assistant

## 2012-08-25 VITALS — BP 110/78 | HR 83 | Temp 98.9°F | Resp 16 | Ht 65.0 in | Wt 178.2 lb

## 2012-08-25 DIAGNOSIS — F419 Anxiety disorder, unspecified: Secondary | ICD-10-CM

## 2012-08-25 DIAGNOSIS — E785 Hyperlipidemia, unspecified: Secondary | ICD-10-CM

## 2012-08-25 DIAGNOSIS — F32A Depression, unspecified: Secondary | ICD-10-CM

## 2012-08-25 DIAGNOSIS — F341 Dysthymic disorder: Secondary | ICD-10-CM

## 2012-08-25 DIAGNOSIS — F329 Major depressive disorder, single episode, unspecified: Secondary | ICD-10-CM | POA: Insufficient documentation

## 2012-08-25 LAB — LIPID PANEL
LDL Cholesterol: 126 mg/dL — ABNORMAL HIGH (ref 0–99)
Triglycerides: 128 mg/dL (ref <150)

## 2012-08-25 MED ORDER — ZOLPIDEM TARTRATE 10 MG PO TABS: 10.0000 mg | ORAL_TABLET | Freq: Every evening | ORAL | Status: DC | PRN

## 2012-08-25 MED ORDER — ALPRAZOLAM 0.25 MG PO TABS: 0.2500 mg | ORAL_TABLET | Freq: Three times a day (TID) | ORAL | Status: DC | PRN

## 2012-08-25 MED ORDER — BUPROPION HCL ER (XL) 150 MG PO TB24: 150.0000 mg | ORAL_TABLET | Freq: Every day | ORAL | Status: DC

## 2012-08-25 NOTE — Patient Instructions (Addendum)
Consider scheduling with a counselor.  I really like Human resources officer. Reduce the Labetalol to 100 mg QD, as long as your heart rate stays normal. If you think we need to change the medications in 4-6 weeks, come back and see me!

## 2012-08-25 NOTE — Progress Notes (Signed)
Subjective:    Patient ID: Suzanne Black, female    DOB: 08/13/1986, 26 y.o.   MRN: [id number]  HPI This 26 y.o. female presents for evaluation of hyperlipidemia, noted at her visit 01/14/2012.  Since then she has been taking OTC fish oil.    Her life has become much more stressful since then as well.  Her husband, who works on [employer], has been traveling for work and gone for most of the past 6 months, leaving her to care for their 50 year-old daughter.  She was working part-time, until Advertising account executive left and she's been helping to cover the shifts.  Her paternal grandfather died recently from Alzheimer's (also metastatic lung CA) and her father has been grieving.  On top of this, her step-mother recently had a large mass excised from her neck (pathology is pending).  She feels overwhelmed and out of control.  Her anxiety and depression were previously controlled on citalopram and prn alprazolam, but she's needing the alprazolam on the days that she works, as frequently as TID, and needs Ambien to sleep each night. No SI/HI.  At her last visit we planned for her to reduce and d/c the labetalol.  She reduced it to 200 mg at HS without difficulty, but when she d/c'd it, she had tachycardia.  She recalls that she had a rapid heart rate when she was put on it initially.  BP has been well controlled, often SBP<100.    Review of Systems As above.   Past Medical History  Diagnosis Date  . H. pylori infection   . Hypertension   . Depression   . Anxiety   . Scoliosis   . Reflux esophagitis 09/2004  . Essential hypertension, benign 11/2007  . Rosacea     History reviewed. No pertinent past surgical history.  Prior to Admission medications   Medication Sig Start Date End Date Taking? Authorizing Provider  ALPRAZolam Prudy Feeler) 0.25 MG tablet Take 1 tablet (0.25 mg total) by mouth 3 (three) times daily as needed for sleep. 08/25/12  Yes Delisia Mcquiston S Joshau Code, PA-C  citalopram  (CELEXA) 40 MG tablet Take 1 tablet (40 mg total) by mouth daily. 01/14/12  Yes Melynda Krzywicki S Keyon Winnick, PA-C  desogestrel-ethinyl estradiol (KARIVA,AZURETTE,MIRCETTE) 0.15-0.02/0.01 MG (21/5) tablet Take 1 tablet by mouth daily.   Yes Historical Provider, MD  labetalol (NORMODYNE) 200 MG tablet Take 200 mg by mouth daily.    Yes Historical Provider, MD  zolpidem (AMBIEN) 10 MG tablet Take 1 tablet (10 mg total) by mouth at bedtime as needed for sleep. 08/25/12  Yes Crystie Yanko S Pearley Millington, PA-C    No Known Allergies  History   Social History  . Marital Status: Married    Spouse Name: N/A    Number of Children: N/A  . Years of Education: N/A   Occupational History  . Pharmacy tech [employer]   Social History Main Topics  . Smoking status: Current Every Day Smoker -- 0.3 packs/day    Types: Cigarettes  . Smokeless tobacco: Never Used     Comment: trying to quit; stress relief  . Alcohol Use: No  . Drug Use: No  . Sexually Active: Yes -- Female partner(s)   Other Topics Concern  . Not on file   Social History Narrative   Husband is working out of town.    Family History  Problem Relation Age of Onset  . Hypertension Father   . Hyperlipidemia Father   . Mental illness Maternal Uncle   .  Mental illness Mother     anxiety  . Cancer Paternal Grandfather     lung cancer with bone metastasis  . Alzheimer's disease Paternal Grandfather        Objective:   Physical Exam Blood pressure 110/78, pulse 83, temperature 98.9 F (37.2 C), temperature source Oral, resp. rate 16, height 5\' 5"  (1.651 m), weight 178 lb 3.2 oz (80.831 kg), last menstrual period 08/11/2012, SpO2 98.00%. Body mass index is 29.65 kg/(m^2). Well-developed, well nourished WF who is awake, alert and oriented, in NAD. HEENT: [place]/AT, sclera and conjunctiva are clear.   Neck: supple, non-tender, no lymphadenopathy, thyromegaly. Heart: RRR, no murmur Lungs: normal effort, CTA Skin: warm and dry. Psychologic: good mood and  appropriate affect, normal speech and behavior.     Assessment & Plan:   1. Hyperlipidemia  Lipid panel; continue OTC fish oil, healthy eating, exercise.  2. Anxiety and depression  zolpidem (AMBIEN) 10 MG tablet, ALPRAZolam (XANAX) 0.25 MG tablet, buPROPion (WELLBUTRIN XL) 150 MG 24 hr tablet   Await labs.  Reduce Labetalol to 100 mg PO QD by breaking the 200 mg tab in half.  If her tachycardia recurs, she can increase back to 200 mg QD.  RTC in 6 months (consider d/c Wellbutrin at that time), though if she is still needing the alprazolam regularly in 4-6 weeks, RTC to discuss increasing Wellbutrin to 300 mg.  In the meantime, she'll consider counseling as well.

## 2012-08-26 ENCOUNTER — Encounter: Payer: Self-pay | Admitting: Physician Assistant

## 2012-09-21 ENCOUNTER — Other Ambulatory Visit: Payer: Self-pay | Admitting: Physician Assistant

## 2012-09-22 ENCOUNTER — Other Ambulatory Visit: Payer: Self-pay | Admitting: Radiology

## 2012-10-22 ENCOUNTER — Other Ambulatory Visit: Payer: Self-pay | Admitting: Physician Assistant

## 2012-10-23 ENCOUNTER — Telehealth: Payer: Self-pay

## 2012-10-23 NOTE — Telephone Encounter (Signed)
Message sent to [facility]. Request came in yesterday. Will call patient when I get response.

## 2012-10-23 NOTE — Telephone Encounter (Signed)
[facility] sent in request for xanax and ambiem Monday, I remember seeing it in the rx request pool and it's no longer there so I don't know if it has been denied, but patient is wondering what the status is. She works at [employer] so she knows it has not been called in.  Best [phone]

## 2012-10-26 ENCOUNTER — Telehealth: Payer: Self-pay | Admitting: Radiology

## 2012-10-26 ENCOUNTER — Other Ambulatory Visit: Payer: Self-pay | Admitting: Radiology

## 2012-10-26 NOTE — Telephone Encounter (Signed)
I authorized refills, but since she's needing them pretty regularly, advise her to RTC before she runs out.  Sunds like we need to make some other adjustments.

## 2012-10-26 NOTE — Telephone Encounter (Signed)
Spoke w/pt and notified her that Chelle approved 1 mos RFs of both medications (see prev phone message) but that she feels that pt needs to RTC to discuss before more RFs are needed. Pt agreed but stated that she was considering making an appt w/psychiatrist as discussed w/Chelle. I advised pt that she can do either one she wishes but to notify us if she makes an appt w/psych and can not see them before more RFs are needed so that we can check w/Chelle to see if she needs to return to see her in between. Pt agreed. I gave pharmacy RFs over the phone bc they did not have a record of them.

## 2012-10-26 NOTE — Telephone Encounter (Signed)
Suzanne Black called these in, per pharmacy. I left message for her to advise she will need a follow up appt before these run out.

## 2012-12-08 ENCOUNTER — Telehealth: Payer: Self-pay

## 2012-12-08 MED ORDER — OMEPRAZOLE 40 MG PO CPDR: 40.0000 mg | DELAYED_RELEASE_CAPSULE | Freq: Every day | ORAL | Status: DC

## 2012-12-08 NOTE — Telephone Encounter (Signed)
Suzanne Black pharmacy sent over a fax for refill on Omeprazole 40 mg. Can we refill?

## 2012-12-08 NOTE — Telephone Encounter (Signed)
I sent RF

## 2012-12-18 ENCOUNTER — Telehealth: Payer: Self-pay | Admitting: Radiology

## 2012-12-18 MED ORDER — LABETALOL HCL 200 MG PO TABS: 200.0000 mg | ORAL_TABLET | Freq: Every day | ORAL | Status: DC

## 2012-12-18 NOTE — Telephone Encounter (Signed)
Suzanne Black pharmacy has sent fax can you renew her Labetalol 200mg ? She takes bid. She has previously gotten from Dr Egbert Garibaldi. Can we review.

## 2012-12-18 NOTE — Telephone Encounter (Signed)
Sent to pharmacy.  Looks like at last visit pt discussed decreasing and possibly discontinuing this medication.  Last note states due for f/u May 2014

## 2013-02-23 ENCOUNTER — Ambulatory Visit (INDEPENDENT_AMBULATORY_CARE_PROVIDER_SITE_OTHER): Payer: 59 | Admitting: Physician Assistant

## 2013-02-23 ENCOUNTER — Encounter: Payer: Self-pay | Admitting: Physician Assistant

## 2013-02-23 VITALS — BP 129/87 | HR 101 | Temp 98.1°F | Resp 16 | Ht 65.5 in | Wt 189.2 lb

## 2013-02-23 DIAGNOSIS — IMO0001 Reserved for inherently not codable concepts without codable children: Secondary | ICD-10-CM

## 2013-02-23 DIAGNOSIS — F341 Dysthymic disorder: Secondary | ICD-10-CM

## 2013-02-23 DIAGNOSIS — Z124 Encounter for screening for malignant neoplasm of cervix: Secondary | ICD-10-CM

## 2013-02-23 DIAGNOSIS — I10 Essential (primary) hypertension: Secondary | ICD-10-CM

## 2013-02-23 DIAGNOSIS — F17200 Nicotine dependence, unspecified, uncomplicated: Secondary | ICD-10-CM

## 2013-02-23 DIAGNOSIS — Z309 Encounter for contraceptive management, unspecified: Secondary | ICD-10-CM

## 2013-02-23 DIAGNOSIS — Z0000 Encounter for general adult medical examination without abnormal findings: Secondary | ICD-10-CM

## 2013-02-23 DIAGNOSIS — E785 Hyperlipidemia, unspecified: Secondary | ICD-10-CM

## 2013-02-23 DIAGNOSIS — F419 Anxiety disorder, unspecified: Secondary | ICD-10-CM

## 2013-02-23 DIAGNOSIS — Z720 Tobacco use: Secondary | ICD-10-CM

## 2013-02-23 DIAGNOSIS — R635 Abnormal weight gain: Secondary | ICD-10-CM

## 2013-02-23 LAB — POCT URINALYSIS DIPSTICK
Blood, UA: NEGATIVE
Nitrite, UA: NEGATIVE
Protein, UA: NEGATIVE
Spec Grav, UA: 1.015
Urobilinogen, UA: 0.2

## 2013-02-23 LAB — CBC WITH DIFFERENTIAL/PLATELET
Basophils Relative: 1 % (ref 0–1)
Eosinophils Absolute: 0 10*3/uL (ref 0.0–0.7)
Hemoglobin: 13.6 g/dL (ref 12.0–15.0)
Lymphs Abs: 2.4 10*3/uL (ref 0.7–4.0)
MCH: 29.4 pg (ref 26.0–34.0)
Neutro Abs: 3.8 10*3/uL (ref 1.7–7.7)
Neutrophils Relative %: 58 % (ref 43–77)
Platelets: 253 10*3/uL (ref 150–400)
RBC: 4.63 MIL/uL (ref 3.87–5.11)

## 2013-02-23 LAB — COMPREHENSIVE METABOLIC PANEL
AST: 12 U/L (ref 0–37)
Alkaline Phosphatase: 59 U/L (ref 39–117)
CO2: 21 mEq/L (ref 19–32)
Calcium: 9.4 mg/dL (ref 8.4–10.5)
Potassium: 4.1 mEq/L (ref 3.5–5.3)
Sodium: 137 mEq/L (ref 135–145)
Total Bilirubin: 0.3 mg/dL (ref 0.3–1.2)

## 2013-02-23 LAB — POCT UA - MICROSCOPIC ONLY
Bacteria, U Microscopic: NEGATIVE
Casts, Ur, LPF, POC: NEGATIVE
Crystals, Ur, HPF, POC: NEGATIVE
Mucus, UA: NEGATIVE
Yeast, UA: NEGATIVE

## 2013-02-23 LAB — LIPID PANEL
Cholesterol: 217 mg/dL — ABNORMAL HIGH (ref 0–200)
HDL: 45 mg/dL (ref >39)
Total CHOL/HDL Ratio: 4.8 Ratio
Triglycerides: 91 mg/dL (ref <150)

## 2013-02-23 LAB — TSH: TSH: 1.595 u[IU]/mL (ref 0.350–4.500)

## 2013-02-23 MED ORDER — DESOGESTREL-ETHINYL ESTRADIOL 0.15-0.02/0.01 MG (21/5) PO TABS: 1.0000 | ORAL_TABLET | Freq: Every day | ORAL | Status: AC

## 2013-02-23 MED ORDER — LABETALOL HCL 200 MG PO TABS: 200.0000 mg | ORAL_TABLET | Freq: Every day | ORAL | Status: AC

## 2013-02-23 NOTE — Patient Instructions (Addendum)
Monitor your headaches.  If they persist, with reduced work stress, we'll need to re-evaluate. I suggest that we increase the Wellbutrin dose to 300 mg.  Your psychiatrist can do that, or I can.  Just let me know.  I'd like to see you need the alprazolam less frequently.  Keeping You Healthy  Get These Tests 1. Blood Pressure- Have your blood pressure checked once a year by your health care provider.  Normal blood pressure is 120/80. 2. Weight- Have your body mass index (BMI) calculated to screen for obesity.  BMI is measure of body fat based on height and weight.  You can also calculate your own BMI at https://www.west-esparza.com/. 3. Cholesterol- Have your cholesterol checked every 5 years starting at age 37 then yearly starting at age 74. 4. Chlamydia, HIV, and other sexually transmitted diseases- Get screened every year until age 69, then within three months of each new sexual provider. 5. Pap Smear- Every 1-3 years; discuss with your health care provider. 6. Mammogram- Every year starting at age 54  Take these medicines  Calcium with Vitamin D-Your body needs 1200 mg of Calcium each day and [phone] IU of Vitamin D daily.  Your body can only absorb 500 mg of Calcium at a time so Calcium must be taken in 2 or 3 divided doses throughout the day.  Multivitamin with folic acid- Once daily if it is possible for you to become pregnant.  Get these Immunizations  Gardasil-Series of three doses; prevents HPV related illness such as genital warts and cervical cancer.  Menactra-Single dose; prevents meningitis.  Tetanus shot- Every 10 years.  Flu shot-Every year.  Take these steps 1. Do not smoke-Your healthcare provider can help you quit.  For tips on how to quit go to www.smokefree.gov or call 1-800 QUITNOW. 2. Be physically active- Exercise 5 days a week for at least 30 minutes.  If you are not already physically active, start slow and gradually work up to 30 minutes of moderate physical  activity.  Examples of moderate activity include walking briskly, dancing, swimming, bicycling, etc. 3. Breast Cancer- A self breast exam every month is important for early detection of breast cancer.  For more information and instruction on self breast exams, ask your healthcare provider or [place]. 4. Eat a healthy diet- Eat a variety of healthy foods such as fruits, vegetables, whole grains, low fat milk, low fat cheeses, yogurt, lean meats, poultry and fish, beans, nuts, tofu, etc.  For more information go to www. Thenutritionsource.org 5. Drink alcohol in moderation- Limit alcohol intake to one drink or less per day. Never drink and drive. 6. Depression- Your emotional health is as important as your physical health.  If you're feeling down or losing interest in things you normally enjoy please talk to your healthcare provider about being screened for depression. 7. Dental visit- Brush and floss your teeth twice daily; visit your dentist twice a year. 8. Eye doctor- Get an eye exam at least every 2 years. 9. Helmet use- Always wear a helmet when riding a bicycle, motorcycle, rollerblading or skateboarding. 10. Safe sex- If you may be exposed to sexually transmitted infections, use a condom. 11. Seat belts- Seat belts can save your live; always wear one. 12. Smoke/Carbon Monoxide detectors- These detectors need to be installed on the appropriate level of your home. Replace batteries at least once a year. 13. Skin cancer- When out in the sun please cover up and use sunscreen 15 SPF or higher. 14. Violence-  If anyone is threatening or hurting you, please tell your healthcare provider.

## 2013-02-23 NOTE — Progress Notes (Signed)
Subjective:    Patient ID: Suzanne Black, female    DOB: 11/02/1985, 27 y.o.   MRN: [id number]  HPI  This 27 y.o. female presents for Annual Wellness Exam.   Past Medical History  Diagnosis Date  . H. pylori infection   . Hypertension   . Depression   . Anxiety   . Scoliosis   . Reflux esophagitis 09/2004  . Essential hypertension, benign 11/2007  . Rosacea     History reviewed. No pertinent past surgical history.  Prior to Admission medications   Medication Sig Start Date End Date Taking? Authorizing Provider  ALPRAZolam (XANAX) 0.25 MG tablet Take 1 tablet (0.25 mg total) by mouth 3 (three) times daily as needed for anxiety. Need office visit for additional refills. 10/22/12  Yes Phuong Moffatt S Esli Jernigan, PA-C  buPROPion (WELLBUTRIN XL) 150 MG 24 hr tablet Take 1 tablet (150 mg total) by mouth daily. 08/25/12  Yes Ermie Glendenning S Zalia Hautala, PA-C  citalopram (CELEXA) 40 MG tablet Take 1 tablet (40 mg total) by mouth daily. 01/14/12  Yes Quashawn Jewkes S Jahree Dermody, PA-C  desogestrel-ethinyl estradiol (KARIVA,AZURETTE,MIRCETTE) 0.15-0.02/0.01 MG (21/5) tablet Take 1 tablet by mouth daily.   Yes Historical Provider, MD  labetalol (NORMODYNE) 200 MG tablet Take 1 tablet (200 mg total) by mouth daily. 12/18/12  Yes Eleanore Delia Chimes, PA-C  zolpidem (AMBIEN) 10 MG tablet Take 1 tablet (10 mg total) by mouth at bedtime as needed for sleep. Need office visit for additional refills. 10/22/12  Yes Oluwatomisin Deman S Makaelah Cranfield, PA-C  omeprazole (PRILOSEC) 40 MG capsule Take 40 mg by mouth daily as needed. 12/08/12   Anders Simmonds, PA-C    No Known Allergies  History   Social History  . Marital Status: Married    Spouse Name: Jomarie Longs    Number of Children: 1  . Years of Education: 12   Occupational History  . Pharmacy tech [employer]   Social History Main Topics  . Smoking status: Current Every Day Smoker -- 0.30 packs/day    Types: Cigarettes  . Smokeless tobacco: Never Used     Comment: trying to quit; stress relief;  husband smokes  . Alcohol Use: No  . Drug Use: No  . Sexually Active: Yes -- Female partner(s)   Other Topics Concern  . Not on file   Social History Narrative   Husband is working out of town.    Family History  Problem Relation Age of Onset  . Hypertension Father   . Hyperlipidemia Father   . Mental illness Maternal Uncle   . Mental illness Mother     anxiety  . Cancer Paternal Grandfather     lung cancer with bone metastasis  . Alzheimer's disease Paternal Grandfather      Review of Systems  Constitutional: Positive for unexpected weight change (10 pounds up in 6 months, "I haven't changed anything;" no regular exercise, but doees have an active toddler at home.). Negative for fever, chills, diaphoresis, activity change, appetite change and fatigue.  Eyes: Negative.   Respiratory: Negative.   Cardiovascular: Negative.   Gastrointestinal: Negative.   Endocrine: Negative.   Genitourinary: Negative.   Musculoskeletal: Negative.   Skin: Negative.   Allergic/Immunologic: Negative.   Neurological: Positive for headaches (3-5 days of HA the week prior to onset of menstrual bleeding x 2 months.  Resolve with rest, Excedrin most of the time.  Associated with photophobia and nausea.  No aura-type sysmptoms.). Negative for dizziness, seizures, syncope, weakness, light-headedness and numbness.  Hematological:  Negative.   Psychiatric/Behavioral: Negative for sleep disturbance, self-injury and dysphoric mood. Nervous/anxious: needs alprazolam TID 2-3 days/week.  Hopes to have less stress when she changes to PRN at work next month.        Objective:   Physical Exam  Vitals reviewed. Constitutional: She is oriented to person, place, and time. Vital signs are normal. She appears well-developed and well-nourished. She is active and cooperative. No distress.  HENT:  Head: Normocephalic and atraumatic.  Right Ear: Hearing, tympanic membrane, external ear and ear canal normal. No foreign  bodies.  Left Ear: Hearing, tympanic membrane, external ear and ear canal normal. No foreign bodies.  Nose: Nose normal.  Mouth/Throat: Uvula is midline, oropharynx is clear and moist and mucous membranes are normal. No oral lesions. Normal dentition. No dental abscesses or edematous. No oropharyngeal exudate.  Eyes: Conjunctivae, EOM and lids are normal. Pupils are equal, round, and reactive to light. Right eye exhibits no discharge. Left eye exhibits no discharge. No scleral icterus.  Fundoscopic exam:      The right eye shows no arteriolar narrowing, no AV nicking, no exudate, no hemorrhage and no papilledema.       The left eye shows no arteriolar narrowing, no AV nicking, no exudate, no hemorrhage and no papilledema.  Neck: Trachea normal, normal range of motion and full passive range of motion without pain. Neck supple. No spinous process tenderness and no muscular tenderness present. No mass and no thyromegaly present.  Cardiovascular: Normal rate, regular rhythm, normal heart sounds, intact distal pulses and normal pulses.   Pulmonary/Chest: Effort normal and breath sounds normal. She exhibits no tenderness and no retraction. Right breast exhibits no inverted nipple, no mass, no nipple discharge, no skin change and no tenderness. Left breast exhibits no inverted nipple, no mass, no nipple discharge, no skin change and no tenderness. Breasts are symmetrical.  Abdominal: Soft. Normal appearance and bowel sounds are normal. She exhibits no distension and no mass. There is no hepatosplenomegaly. There is no tenderness. There is no rigidity, no rebound, no guarding, no CVA tenderness, no tenderness at McBurney's point and negative Murphy's sign. No hernia. Hernia confirmed negative in the right inguinal area and confirmed negative in the left inguinal area.  Genitourinary: Rectum normal, vagina normal and uterus normal. Rectal exam shows no external hemorrhoid and no fissure. No breast swelling,  tenderness, discharge or bleeding. Pelvic exam was performed with patient supine. No labial fusion. There is no rash, tenderness, lesion or injury on the right labia. There is no rash, tenderness, lesion or injury on the left labia. Cervix exhibits no motion tenderness, no discharge and no friability. Right adnexum displays no mass, no tenderness and no fullness. Left adnexum displays no mass, no tenderness and no fullness. No erythema, tenderness or bleeding around the vagina. No foreign body around the vagina. No signs of injury around the vagina. No vaginal discharge found.  Musculoskeletal: She exhibits no edema and no tenderness.       Cervical back: Normal.       Thoracic back: Normal.       Lumbar back: Normal.  Lymphadenopathy:       Head (right side): No tonsillar, no preauricular, no posterior auricular and no occipital adenopathy present.       Head (left side): No tonsillar, no preauricular, no posterior auricular and no occipital adenopathy present.    She has no cervical adenopathy.    She has no axillary adenopathy.  Right: No inguinal and no supraclavicular adenopathy present.       Left: No inguinal and no supraclavicular adenopathy present.  Neurological: She is alert and oriented to person, place, and time. She has normal strength and normal reflexes. No cranial nerve deficit. She exhibits normal muscle tone. Coordination and gait normal.  Skin: Skin is warm, dry and intact. No rash noted. She is not diaphoretic. No cyanosis or erythema. Nails show no clubbing.  Psychiatric: She has a normal mood and affect. Her speech is normal and behavior is normal. Judgment and thought content normal.      Assessment & Plan:  Routine general medical examination at a health care facility - Plan: CBC with Differential, POCT UA - Microscopic Only, POCT urinalysis dipstick; Age appropriate anticipatory guidance provided.  Anxiety and depression - discuss with psychiatrist tomorrow.  I am  happy to increase the Wellbutrin XL to 300 mg daily.  Would like to see reduced use of alprazolam.  Weight gain - Plan: TSH  Hyperlipidemia - Plan: Comprehensive metabolic panel, Lipid panel  HTN (hypertension) - Plan: labetalol (NORMODYNE) 200 MG tablet  Tobacco abuse - encouraged continued efforts for cessation.  Screening for cervical cancer - Plan: Pap IG w/ reflex to HPV when ASC-U  Contraception - Plan: desogestrel-ethinyl estradiol (KARIVA,AZURETTE,MIRCETTE) 0.15-0.02/0.01 MG (21/5) tablet  Fernande Bras, PA-C Physician Assistant-Certified [facility]

## 2013-02-24 LAB — PAP IG W/ RFLX HPV ASCU

## 2013-03-05 ENCOUNTER — Telehealth: Payer: Self-pay

## 2013-03-05 NOTE — Telephone Encounter (Signed)
Pt called about lab results. Pt notified and has a GYN-has a h/o of abnl paps and will have it rechecked in one year.

## 2013-03-22 IMAGING — CR DG CHEST 2V
2 series · 2 of 2 positions shown · non-contrast
Comparison: None

CLINICAL DATA: Cough, fever.

CHEST - 2 VIEW

[view not recorded (1 of 2)]
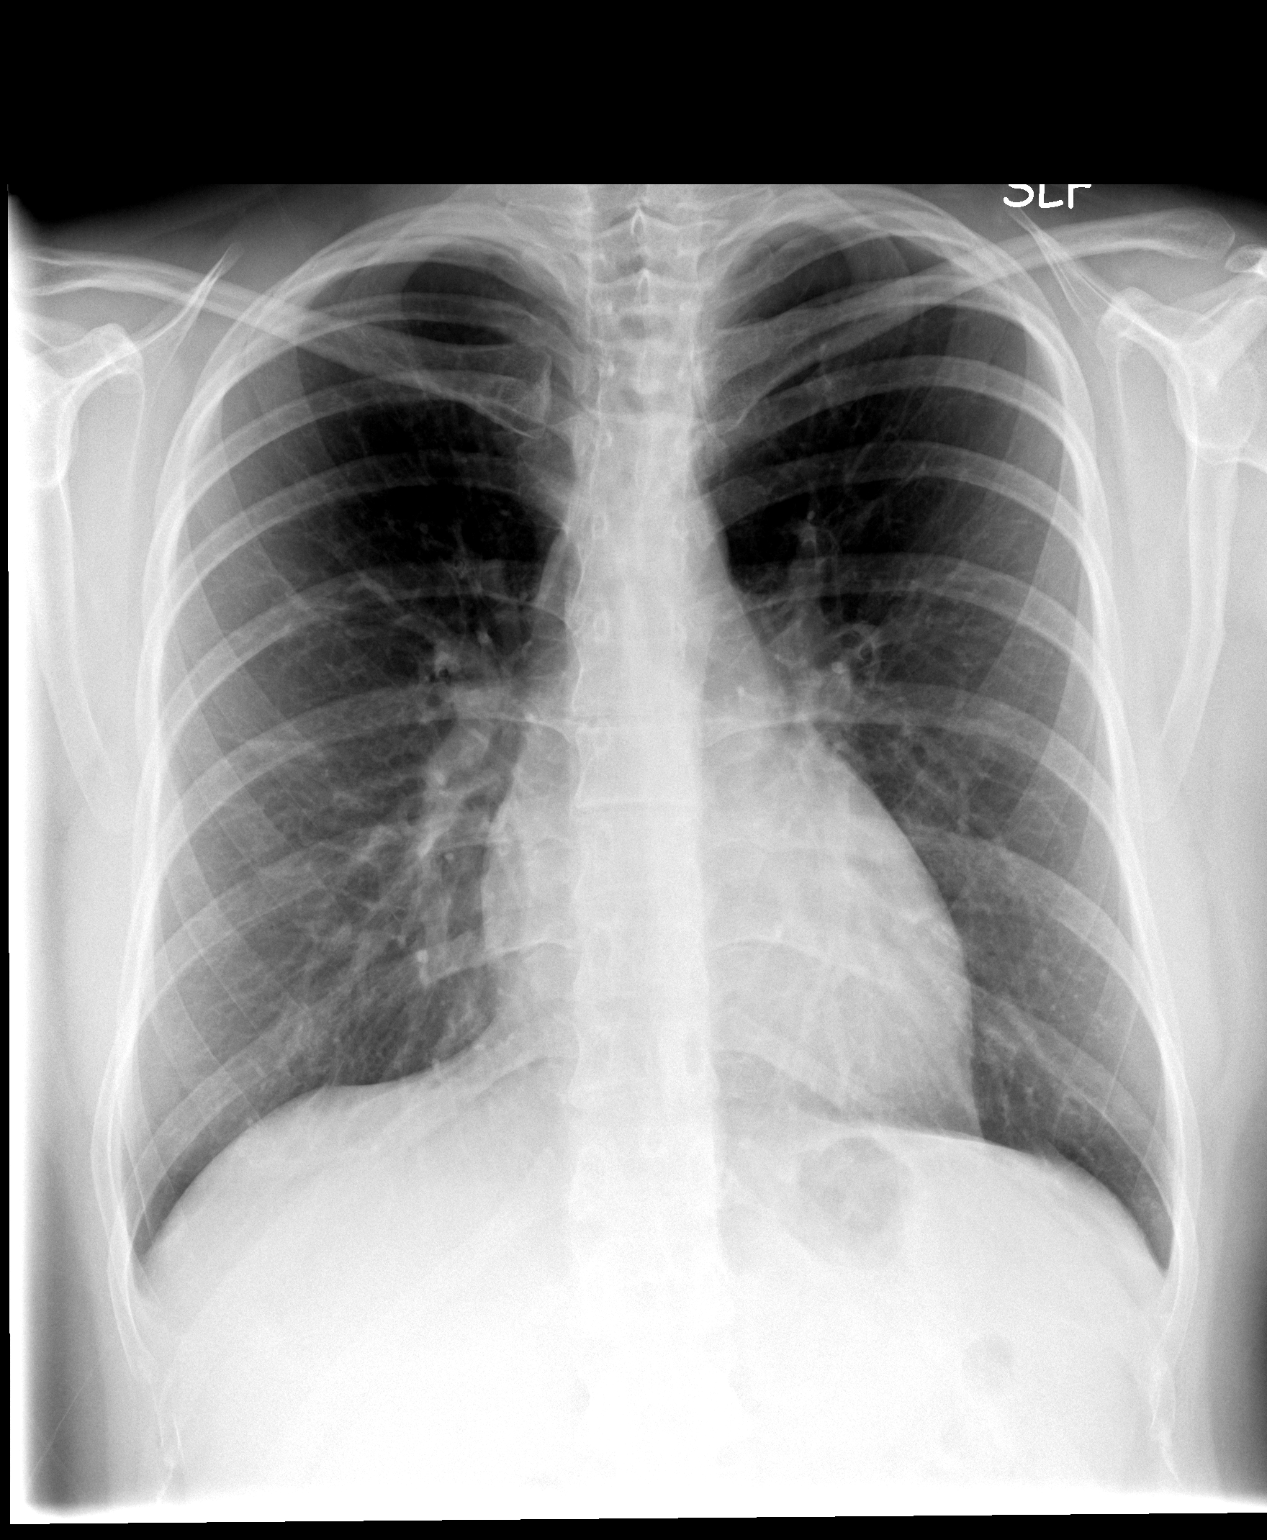

[view not recorded (2 of 2)]
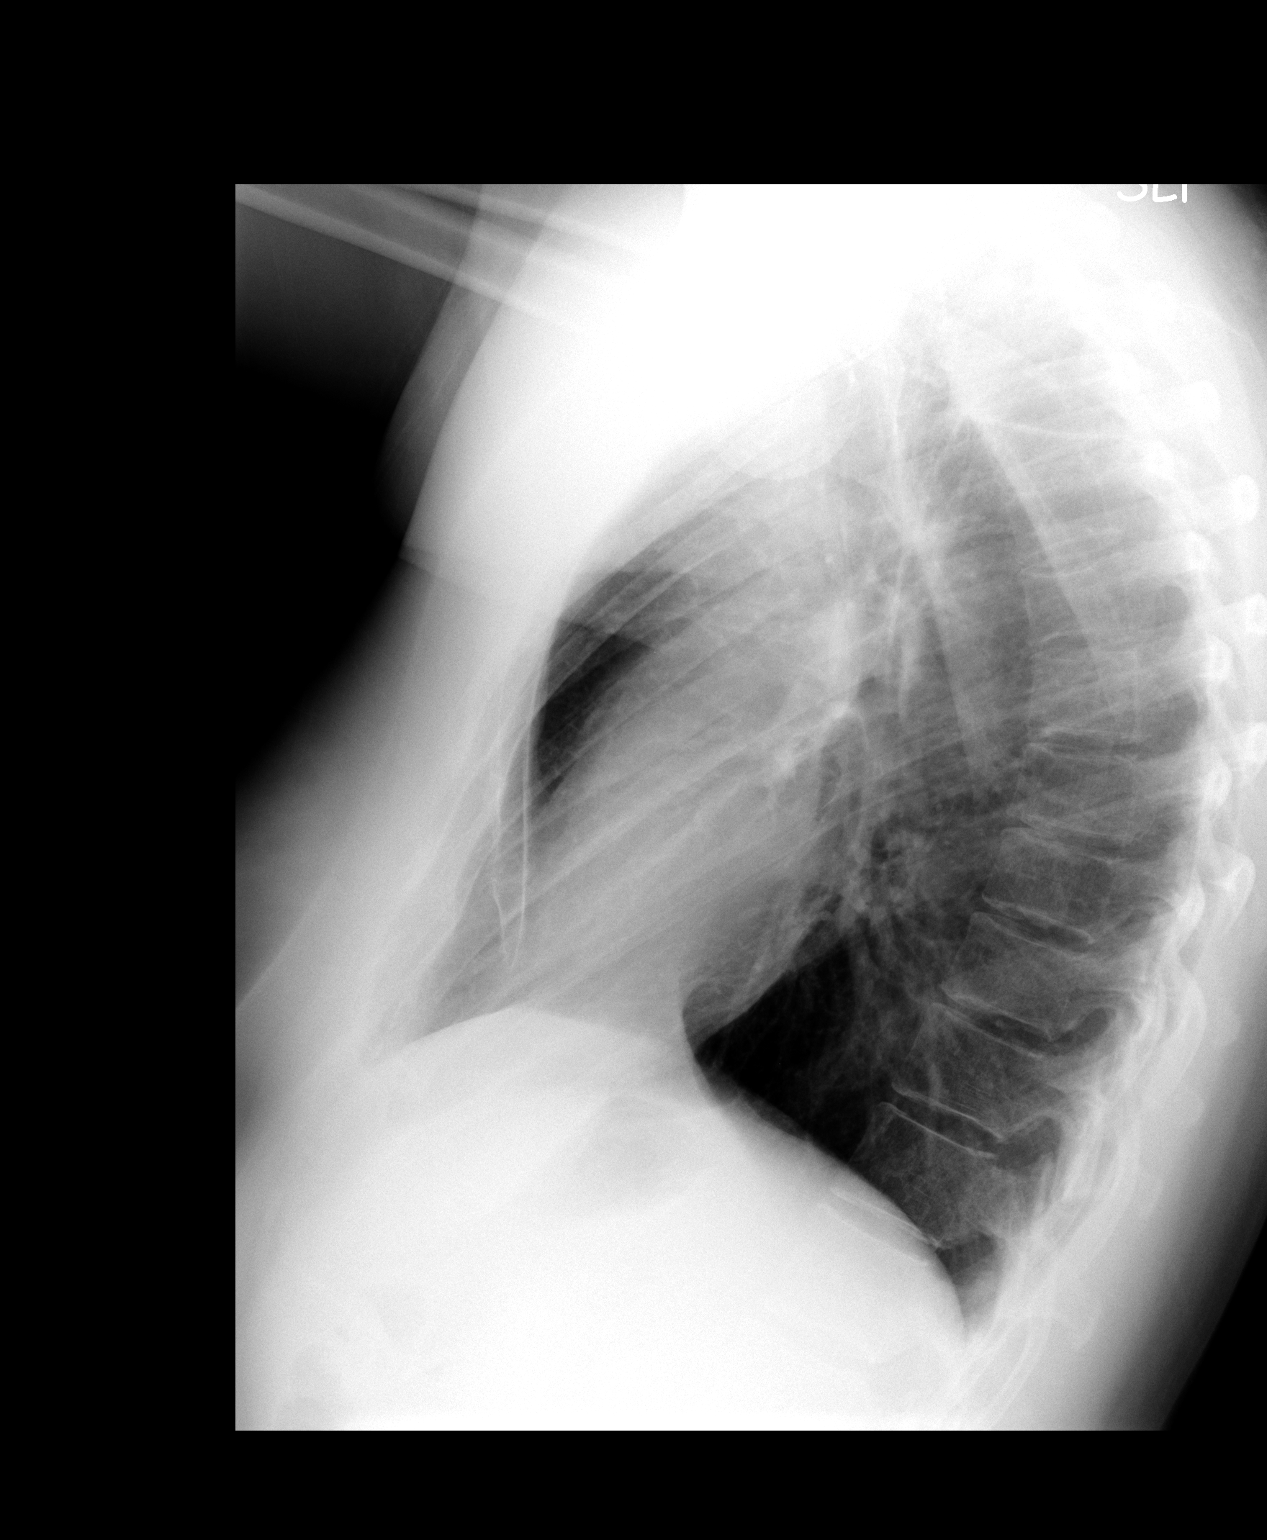

[2 of 2 positions shown; findings below may reference images not displayed]

FINDINGS: Heart and mediastinal contours are within normal limits.
No focal opacities or effusions.  No acute bony abnormality.  Mild
pectus excavatum deformity of the chest wall.
IMPRESSION: No active cardiopulmonary disease.

## 2013-07-26 ENCOUNTER — Other Ambulatory Visit: Payer: Self-pay | Admitting: Physician Assistant

## 2013-08-10 IMAGING — US US ABDOMEN COMPLETE
1 series · 13 of 25 positions shown · non-contrast
Comparison: None

CLINICAL DATA: Abdominal pain.

ABDOMEN ULTRASOUND
TECHNIQUE: Complete abdominal ultrasound examination was performed
including evaluation of the liver, gallbladder, bile ducts,
pancreas, kidneys, spleen, IVC, and abdominal aorta.

[Series 1: us abdomen complete · 0.25mm/px · 13 of 79 slices shown]
[im 1/79]
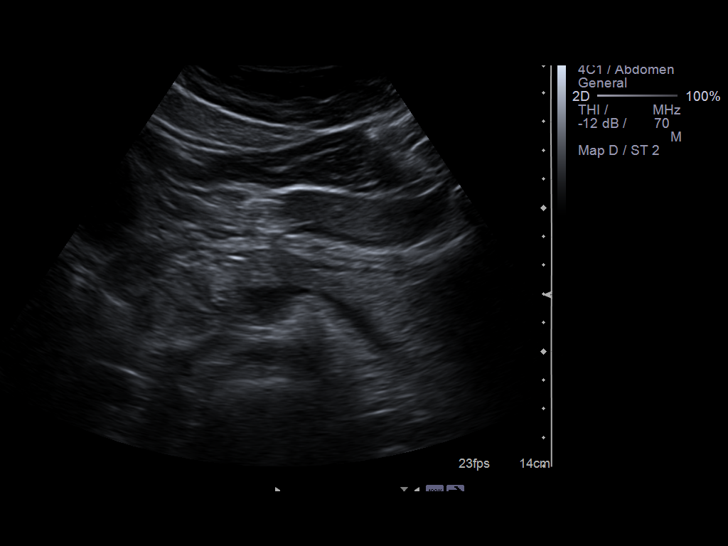
[im 7/79]
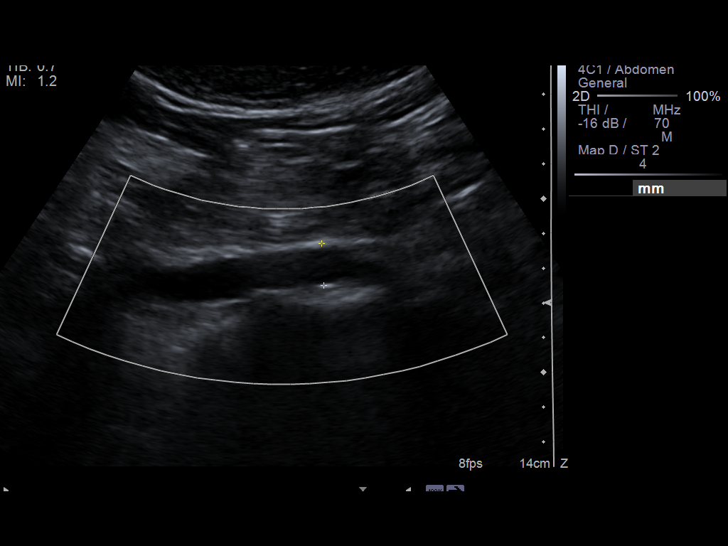
[im 14/79]
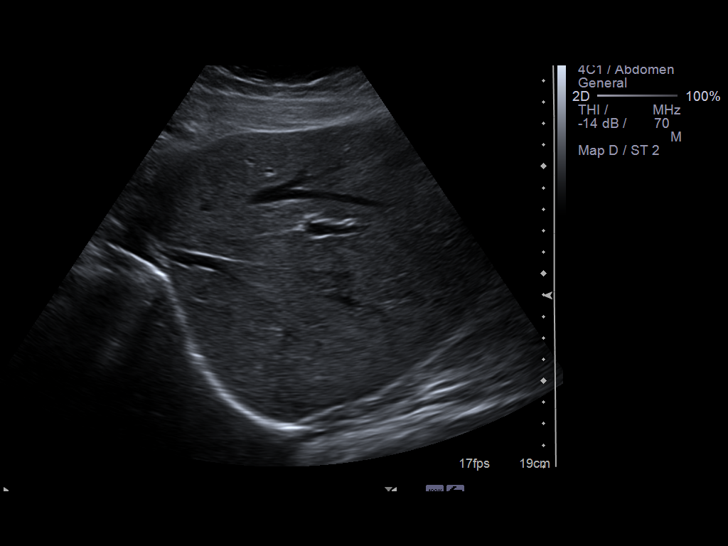
[im 20/79]
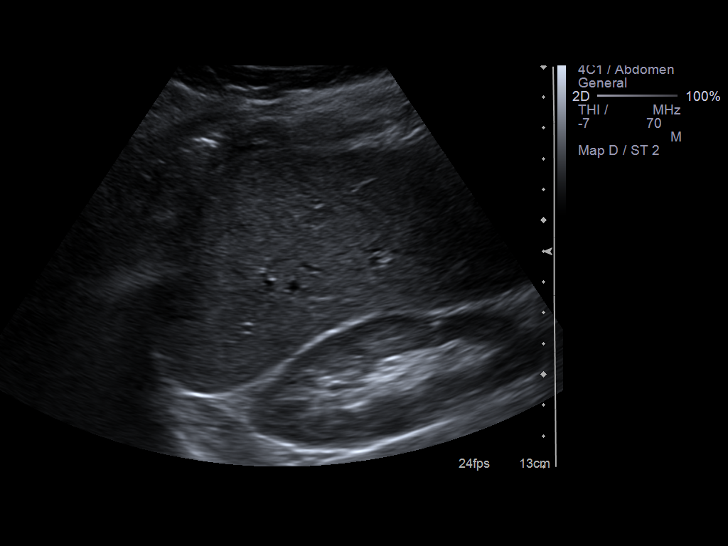
[im 27/79]
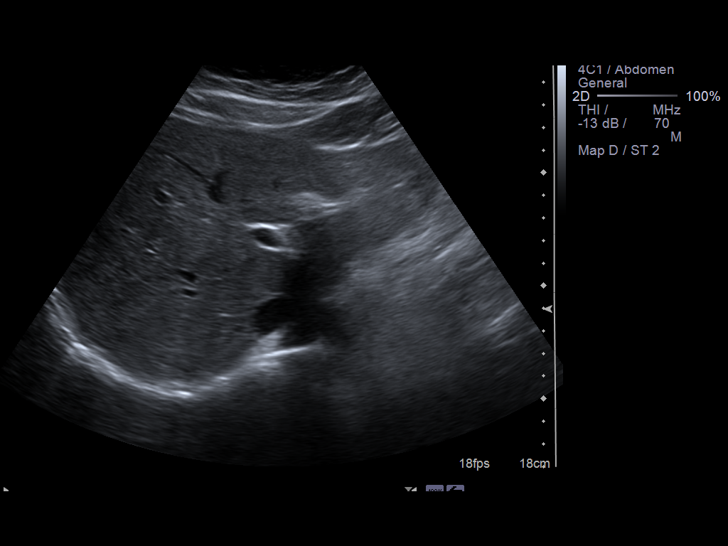
[im 33/79]
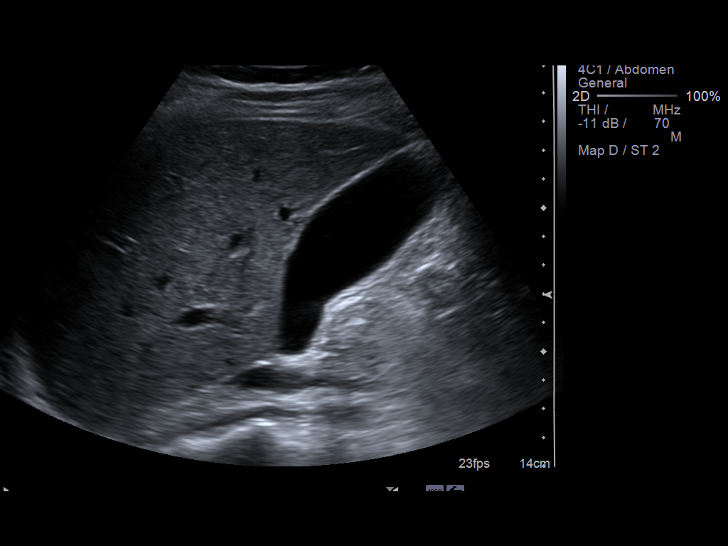
[im 40/79]
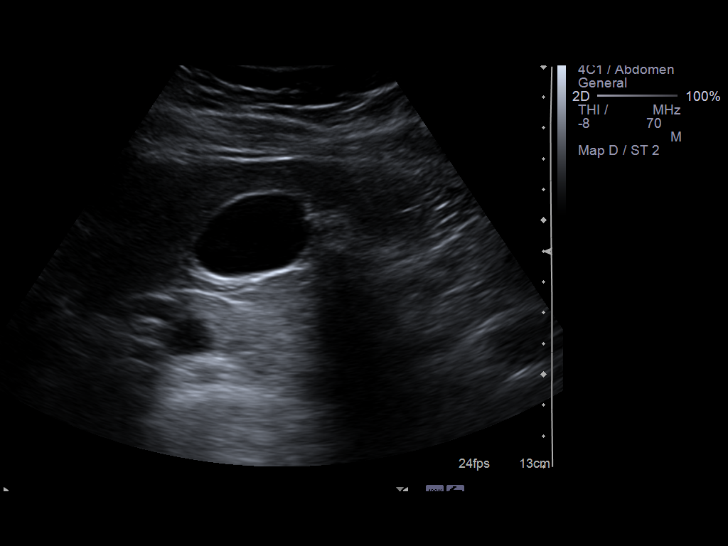
[im 46/79]
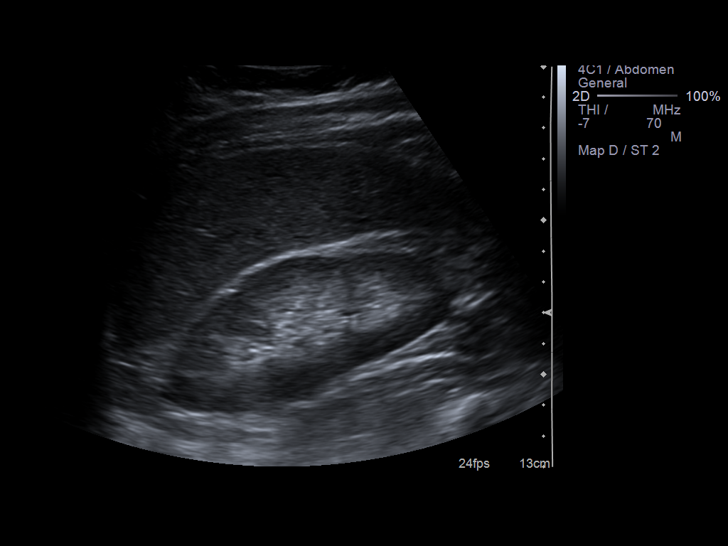
[im 53/79]
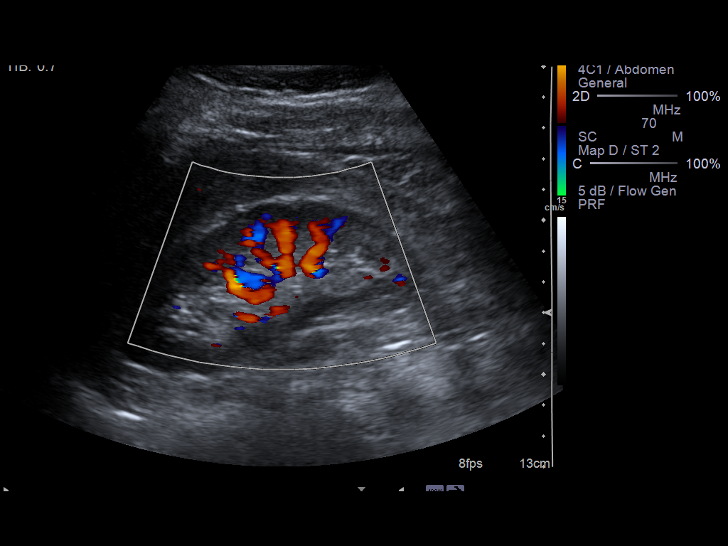
[im 59/79]
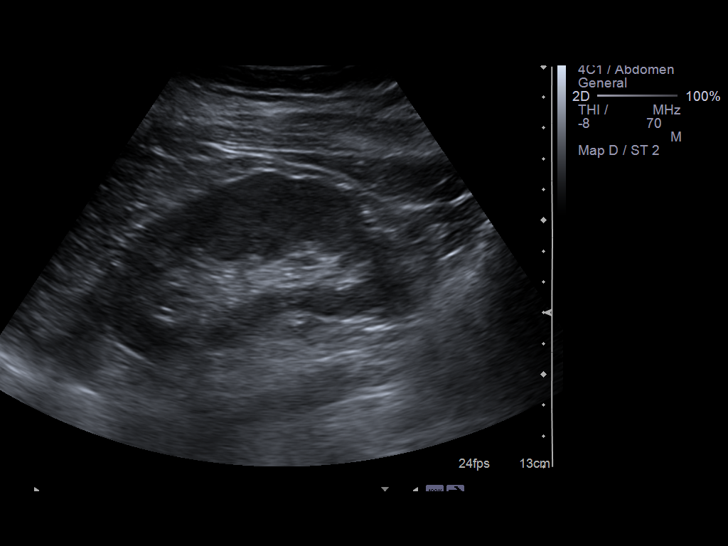
[im 66/79]
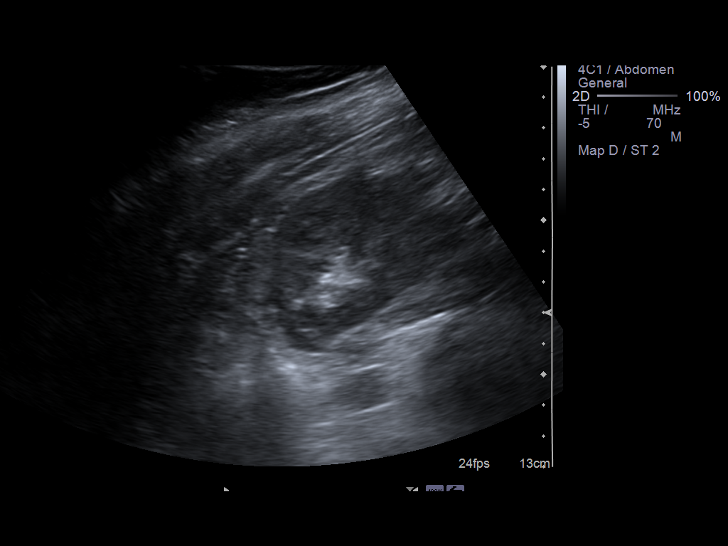
[im 72/79]
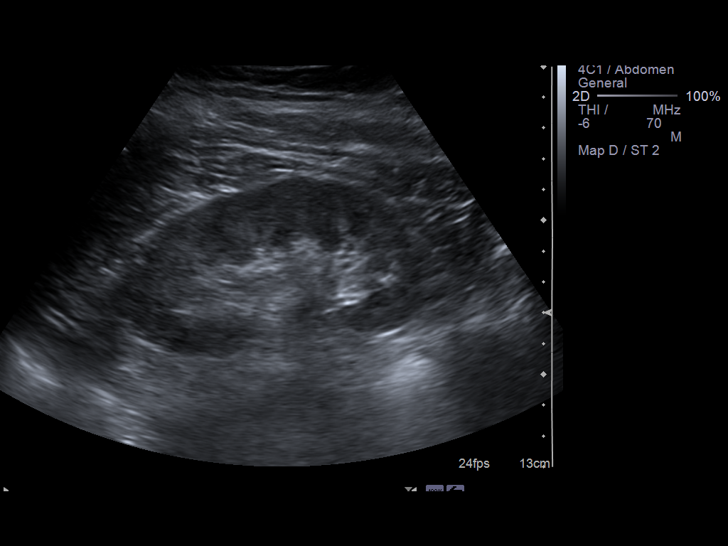
[im 79/79]
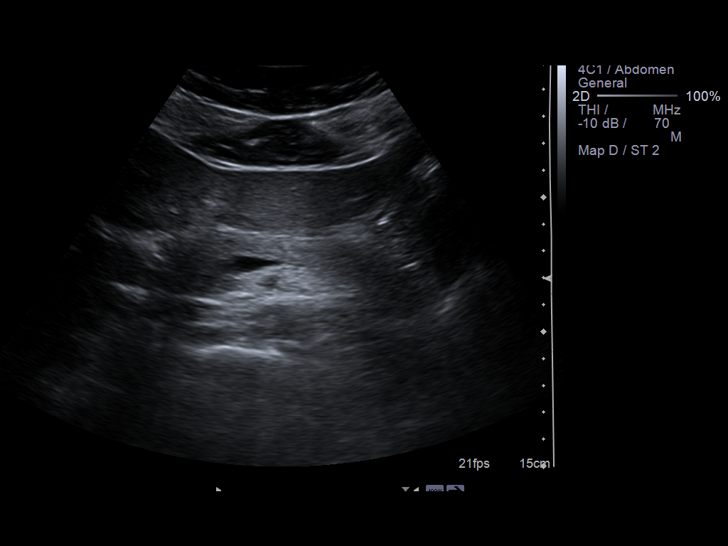

[13 of 25 positions shown; findings below may reference images not displayed]

FINDINGS: Gallbladder:  No shadowing gallstones or echogenic sludge.  No
gallbladder wall thickening or pericholecystic fluid.  Negative
sonographic Murphy's sign according to the ultrasound technologist.

Common Bile Duct:  Normal caliber of 3 mm.

Liver:  Normal size and echotexture without focal parenchymal
abnormality.  Patent portal vein with hepatopetal flow.

IVC:  Patent throughout its visualized course in the abdomen.

Pancreas:  Although the pancreas is difficult to visualize in its
entirety, no focal pancreatic abnormality is identified.

Spleen:  The spleen is of normal echotexture and size.

Kidneys:  The right kidney measures 10.2 cm and the left kidney
10.7 cm.  Suggestion of small echogenic foci in the left kidney may
represent vessels or small calculi.

Abdominal Aorta:  Normal caliber abdominal aorta.
IMPRESSION: No significant abnormalities.  Suggestion of small echogenic foci
in the left kidney which may represent vessels or small calculi.

## 2013-08-26 ENCOUNTER — Telehealth: Payer: Self-pay

## 2013-08-26 DIAGNOSIS — F329 Major depressive disorder, single episode, unspecified: Secondary | ICD-10-CM

## 2013-08-26 MED ORDER — CITALOPRAM HYDROBROMIDE 40 MG PO TABS: ORAL_TABLET | ORAL | Status: AC

## 2013-08-26 MED ORDER — BUPROPION HCL ER (XL) 150 MG PO TB24: 150.0000 mg | ORAL_TABLET | Freq: Every day | ORAL | Status: AC

## 2013-08-26 NOTE — Telephone Encounter (Signed)
Patient is wanting to know if she could get a month worth of refills until her insurance renews in January.   Pharmacy: [facility]   Best#: [phone]

## 2013-08-26 NOTE — Telephone Encounter (Signed)
Sent.  Follow up as planned 

## 2013-08-26 NOTE — Telephone Encounter (Signed)
What meds is she requesting? She needs celexa and wellbutrin pended please advise.

## 2013-09-06 IMAGING — CR DG THORACIC SPINE 2V
3 series · 3 of 3 positions shown · non-contrast
Comparison: None.

CLINICAL DATA: History of back pain.  Evaluate thoracic curvature.

THORACIC SPINE - 2 VIEW

[t thoracic spine ap]
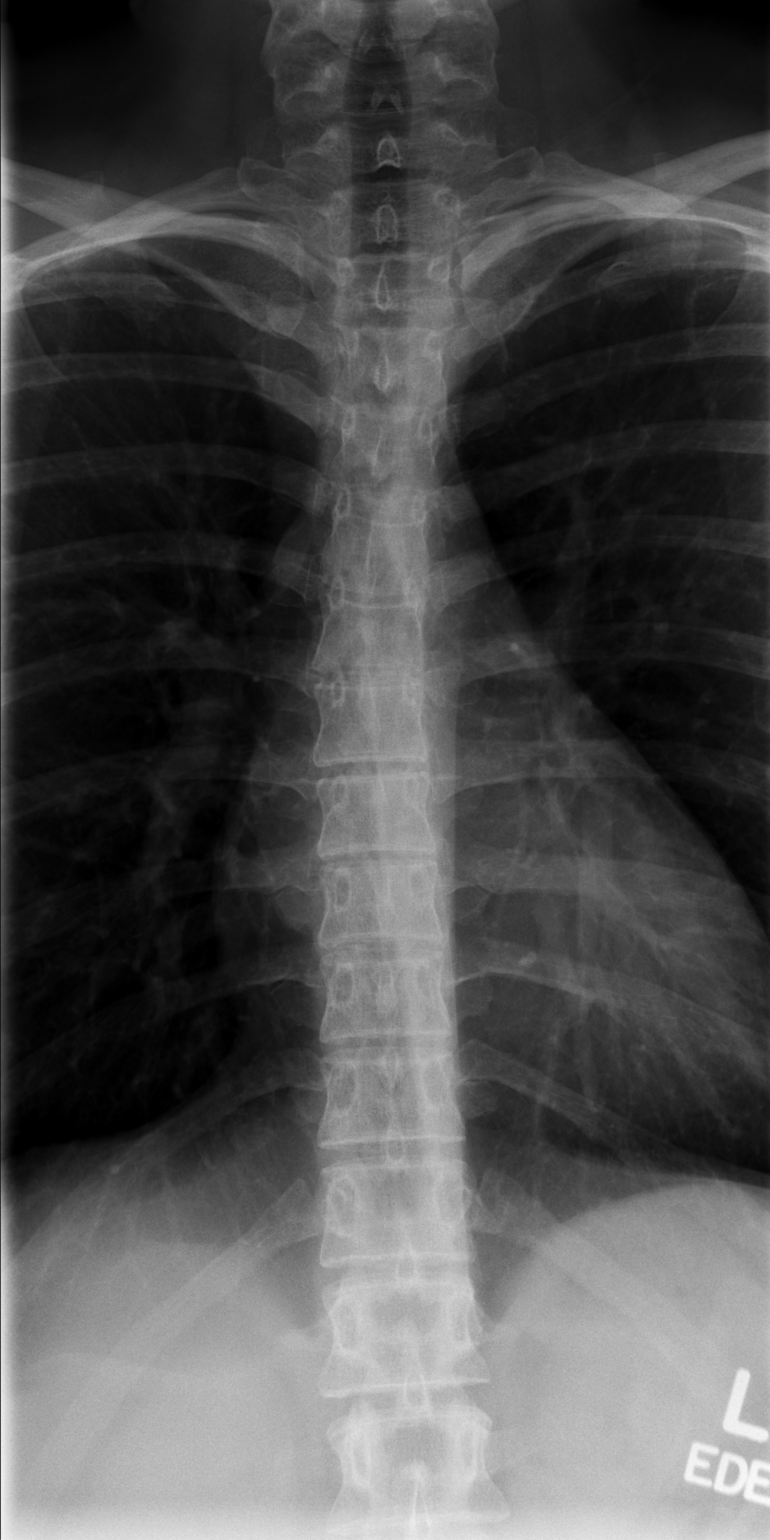

[t thoracic spine lat]
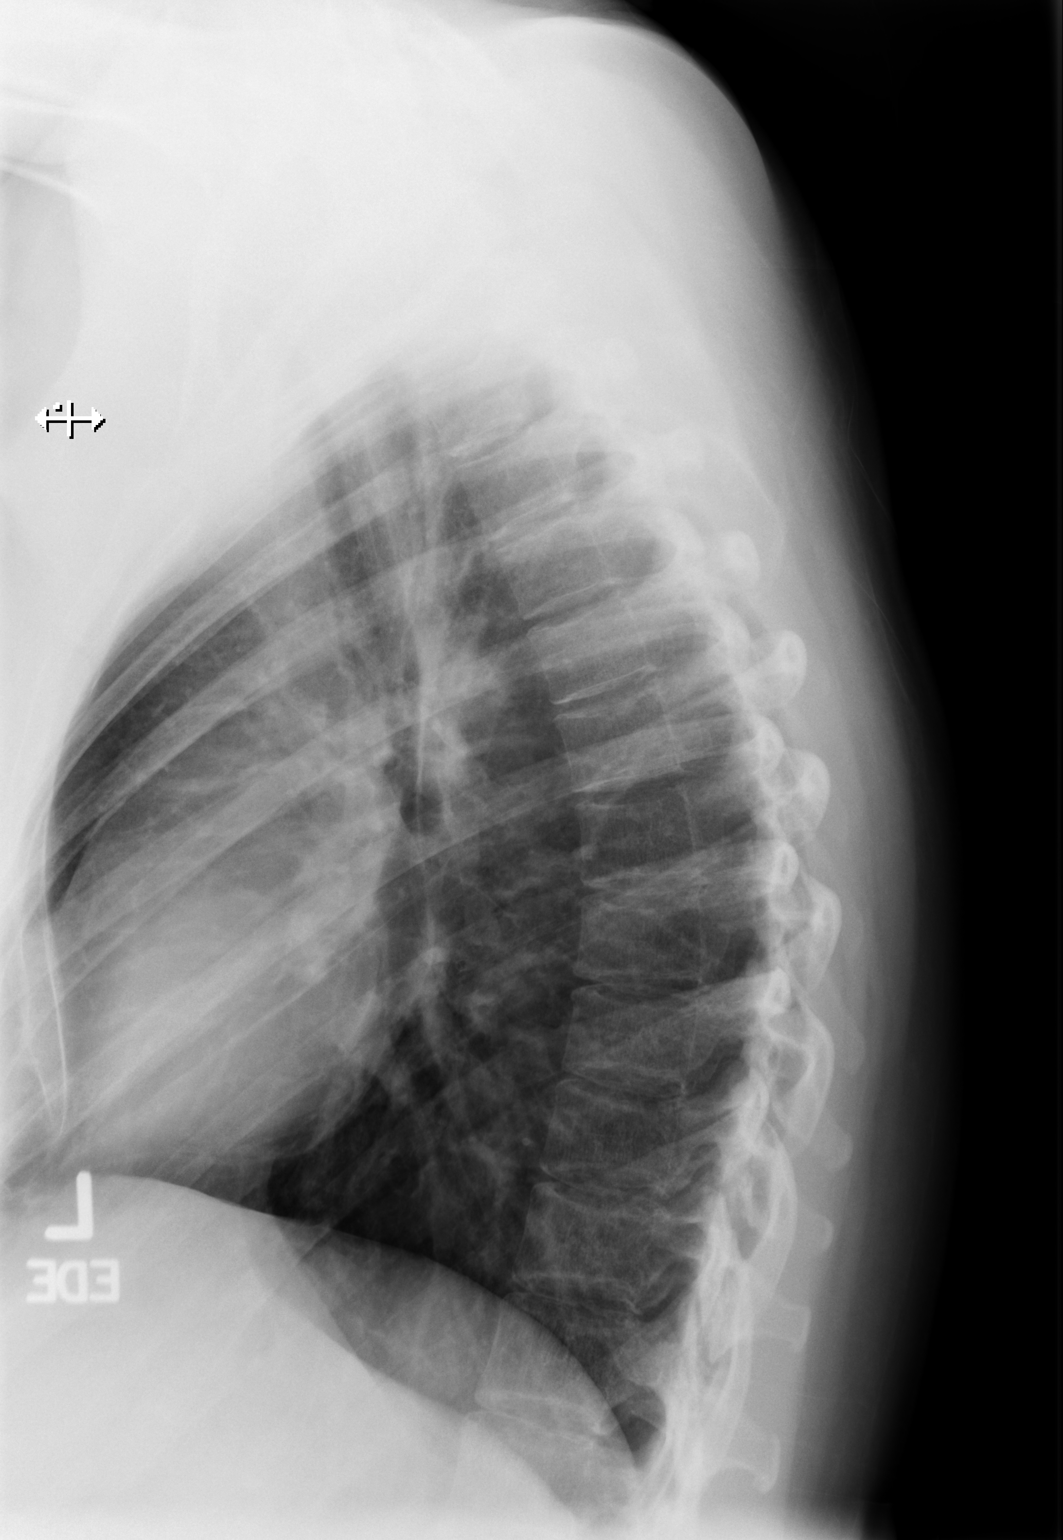

[t thoracic swimmers]
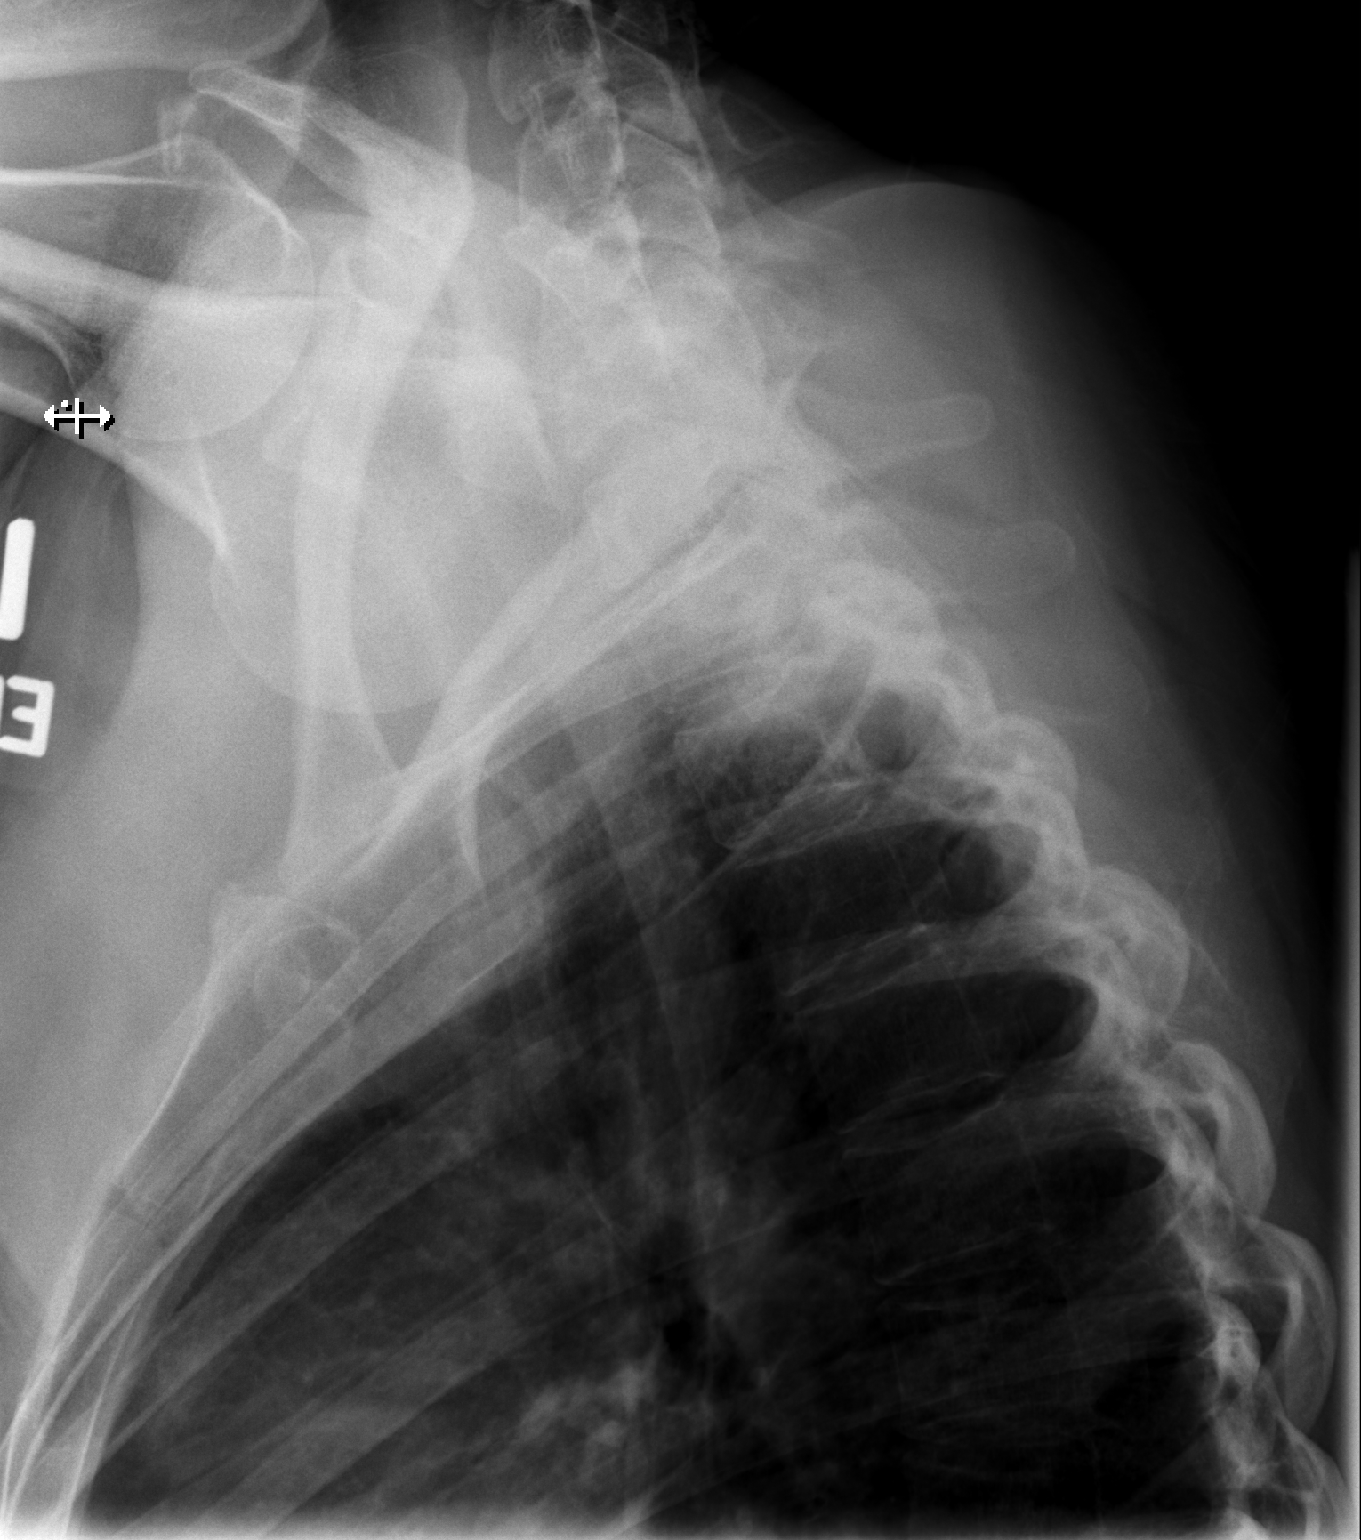

[3 of 3 positions shown; findings below may reference images not displayed]

FINDINGS: There is mild thoracolumbar scoliosis convexity to the
right with Cobbs angle measuring 8 degrees.  Alignment is otherwise
normal.  No kyphosis is seen.  Intervertebral disc spaces appear
maintained.  No fracture or bony destruction is evident.  No
spondylosis is seen.
IMPRESSION: Mild thoracolumbar scoliosis convexity to the right with Cobbs
angle measuring 8 degrees.

## 2017-02-04 MED FILL — ALPRAZolam 0.5 MG TABS: 0.5 | 30 days supply | Qty: 90 | Fill #0

## 2017-02-04 MED FILL — CITALOPRAM HBR 20 MG TABLET: 20 | 30 days supply | Qty: 30 | Fill #0

## 2017-02-27 MED FILL — DESOGESTR-ETH ESTRAD ETH ES: 0.15-0.02/0 | 84 days supply | Qty: 84 | Fill #0

## 2017-03-04 MED FILL — CITALOPRAM HBR 20 MG TABLET: 20 | 30 days supply | Qty: 30 | Fill #1

## 2017-03-04 MED FILL — ALPRAZolam 0.5 MG TABS: 0.5 | 30 days supply | Qty: 90 | Fill #1

## 2017-04-01 MED FILL — CITALOPRAM HBR 20 MG TABLET: 20 | 30 days supply | Qty: 30 | Fill #2

## 2017-04-01 MED FILL — ALPRAZolam 0.5 MG TABS: 0.5 | 30 days supply | Qty: 90 | Fill #2

## 2017-04-29 MED FILL — ALPRAZolam 0.5 MG TABS: 0.5 | 30 days supply | Qty: 90 | Fill #3

## 2017-04-29 MED FILL — CITALOPRAM HBR 20 MG TABLET: 20 | 30 days supply | Qty: 30 | Fill #3

## 2017-05-22 MED FILL — DESOGESTR-ETH ESTRAD ETH ES: 0.15-0.02/0 | 84 days supply | Qty: 84 | Fill #0

## 2017-05-27 MED FILL — CITALOPRAM HBR 20 MG TABLET: 20 | 30 days supply | Qty: 30 | Fill #4

## 2017-05-27 MED FILL — ALPRAZolam 0.5 MG TABS: 0.5 | 30 days supply | Qty: 90 | Fill #4

## 2017-06-27 MED FILL — ALPRAZolam 0.5 MG TABS: 0.5 | 30 days supply | Qty: 90 | Fill #5

## 2017-06-27 MED FILL — CITALOPRAM HBR 20 MG TABLET: 20 | 30 days supply | Qty: 30 | Fill #5

## 2017-07-25 MED FILL — CITALOPRAM HBR 20 MG TABLET: 20 | 30 days supply | Qty: 30 | Fill #0

## 2017-07-25 MED FILL — ALPRAZolam 0.5 MG TABS: 0.5 | 30 days supply | Qty: 90 | Fill #0

## 2017-08-08 ENCOUNTER — Ambulatory Visit (INDEPENDENT_AMBULATORY_CARE_PROVIDER_SITE_OTHER): Payer: 59 | Admitting: Family Medicine

## 2017-08-08 VITALS — BP 124/90 | Ht 65.0 in | Wt 180.0 lb

## 2017-08-08 DIAGNOSIS — M25532 Pain in left wrist: Secondary | ICD-10-CM | POA: Diagnosis not present

## 2017-08-08 DIAGNOSIS — M25571 Pain in right ankle and joints of right foot: Secondary | ICD-10-CM

## 2017-08-08 DIAGNOSIS — M25522 Pain in left elbow: Secondary | ICD-10-CM

## 2017-08-08 NOTE — Progress Notes (Signed)
Chief complaint: Left wrist pain, left elbow pain, right ankle pain status post fall this morning  History of present illness: Suzanne Black is a 31 year old female who presents to the sports medicine office today with chief complaint of left wrist pain, left elbow pain and right ankle pain. She reports that she did fall this morning in front of the pharmacy. She reports that she was carrying her 32-year-old son on her left side, tripped on the ground, her right ankle inverted and she fell to the left side. She reports that while trying to protect her son she braced her left side against the ground and fell on her left side. She reports pain being right now on the ulnar aspect of her left wrist, reports of pain along the medial epicondyle of her left elbow, as well as pain on the outside of her left shoulder. She does not report of any numbness, tingling, or burning paresthesias. She reports that she does feel intermittent shooting pain starting at the left wrist going up the left arm. She does not report of any cuts, abrasions, or bleeding. She does not report of any previous injury to her left arm or her left shoulder. She also reports of right ankle pain, but not as bothersome as her left arm and shoulder. No previous right ankle injury. Right now, she describes the pain as a 5/10, describes the pain aside getting better. She describes the pain as a throbbing pain with intermittent stabbing radiating pain up the left arm. She reports any type of wrist flexion or extension causes pain. She also reports of any type of shoulder abduction is causing pain. She reports that she did take some Aleve a little bit ago. She initially felt that she did need to be seen here but at the recommendation of her coworkers she comes here this morning for evaluation.  Review of systems:  As stated above  Her past medical history, surgical history, family history, and social history obtained. Past medical history notable for GERD,  anxiety, depression. She does not report of any surgeries involving her left arm, left shoulder, or right ankle. She does report of current tobacco use. Family history notable for lung cancer and anxiety  Physical exam: Vital signs are reviewed and are documented in the chart Gen.: Alert, oriented, appears stated age, in no apparent distress HEENT: Moist oral mucosa Respiratory: Normal respirations, able to speak in full sentences Cardiac: Regular rate, distal pulses 2+ Integumentary: No rashes on visible skin, do not see any evidence of any abrasions, cuts, or any bleeding Neurologic: Strength 5/5, sensation 2+ in bilateral upper extremities Psych: Normal affect, mood is described as good Musculoskeletal: Inspection of her left wrist reveal no obvious deformity or muscle atrophy, she does have very minimal ecchymosis and effusion over the ulnar styloid, no warmth or erythema noted, she is tender to palpation over the ulnar styloid, no tenderness to palpation over the dorsal wrists, radius, or palmar aspect of wrist, no tenderness over anywhere in hands or fingers on the left side, she does have slight tenderness to palpation along the dorsal left forearm, she does have full wrist range of motion, but does have slight pain with wrist flexion and wrist extension, she is able to do full finger abduction, abduction, thumb abduction, thumb abduction, and thumb opposition machine he does not have any pain with pronation or supination, does have tenderness to palpation over the medial epicondyle of left elbow, no tenderness over lateral epicondyle, olecranon process, or distal  head of biceps, she does have full elbow range of motion with flexion and extension she is tender to palpation along the deltoid muscle, but no tenderness over the clavicle, acromioclavicular joint, acromion, coracoid, head of biceps, and triceps, she does have full shoulder range of motion but does have pain with shoulder abduction and  external range of motion, inspection in her right ankle reveals no obvious deformity or muscle atrophy, no warmth, erythema, ecchymosis, or effusion, she is slightly tender to deep palpation over the ATFL, she does have full ankle range of motion, dorsiflexion and inversion, slight pain, strength is 5/5 in her right ankle, sensation 2+ and right ankle foot, anterior drawer and talar tilt negative  Assessment and plan: 1. Left ulnar wrist pain s/p fall, suspect ligamentous sprain and bony contusion 2. Left medial epicondyle pain s/p fall, suspect ligamentous sprain and bony contusion 3. Left shoulder pain s/p fall, suspect deltoid strain 4. Right ankle pain s/p fall, suspect ligamentous sprain of ATFL  Plan: Overall, discussed with Suzanne Black that her symptoms all seem consistent with ligamentous sprain and bony contusion status post fall. I did discuss option of her getting x-ray of her left wrist and left elbow, but she would like to hold off for now which I do think is a reasonable option. She does not have any red flag signs or symptoms on history or physical examination. She does have full range of motion of her shoulder, elbow, and wrist. Discussed conservative measures including rest, ice, elevation, and compression. Did give her Ace wrap for her wrist here in the office. Discussed continue with Aleve as needed for pain. Discussed that she can type, but to get a keyboard pad in front the keyboard so that she minimizes repetitive wrist flexion and wrist extension. Doesn't ligamentous sprain of her right ankle, discussed conservative measures to include rest, ice, compression, elevation. [place] rules are negative, do not feel she needs x-rays. Discussed if she is not getting any better in 1 week to let us know and we will order x-rays. Otherwise, will have her follow-up on as-needed basis.   Haynes Kerns, M.D. Primary Care Sports Medicine Fellow [facility]

## 2017-08-19 MED FILL — DESOGESTR-ETH ESTRAD ETH ES: 0.15-0.02/0 | 84 days supply | Qty: 84 | Fill #0

## 2017-08-22 MED FILL — ALPRAZolam 0.5 MG TABS: 0.5 | 30 days supply | Qty: 90 | Fill #1

## 2017-08-22 MED FILL — CITALOPRAM HBR 20 MG TABLET: 20 | 30 days supply | Qty: 30 | Fill #1

## 2017-09-18 MED FILL — ALPRAZolam 0.5 MG TABS: 0.5 | 30 days supply | Qty: 90 | Fill #2

## 2017-09-18 MED FILL — CITALOPRAM HBR 20 MG TABLET: 20 | 30 days supply | Qty: 30 | Fill #2

## 2017-10-16 MED FILL — ALPRAZolam 0.5 MG TABS: 0.5 | 30 days supply | Qty: 90 | Fill #3

## 2017-10-16 MED FILL — CITALOPRAM HBR 20 MG TABLET: 20 | 30 days supply | Qty: 30 | Fill #3

## 2017-10-29 DIAGNOSIS — Z01419 Encounter for gynecological examination (general) (routine) without abnormal findings: Secondary | ICD-10-CM | POA: Diagnosis not present

## 2017-10-29 DIAGNOSIS — Z6833 Body mass index (BMI) 33.0-33.9, adult: Secondary | ICD-10-CM | POA: Diagnosis not present

## 2017-10-30 DIAGNOSIS — I10 Essential (primary) hypertension: Secondary | ICD-10-CM | POA: Diagnosis not present

## 2017-10-30 DIAGNOSIS — F418 Other specified anxiety disorders: Secondary | ICD-10-CM | POA: Diagnosis not present

## 2017-10-30 DIAGNOSIS — F5101 Primary insomnia: Secondary | ICD-10-CM | POA: Diagnosis not present

## 2017-10-31 MED FILL — BUPROPION HCL XL 300 MG TAB: 300 | 30 days supply | Qty: 30 | Fill #0

## 2017-10-31 MED FILL — ATENOLOL 50 MG TABLET: 50 | 30 days supply | Qty: 30 | Fill #0

## 2017-10-31 MED FILL — ZOLPIDEM TARTRATE 10 MG TAB: 10 | 30 days supply | Qty: 30 | Fill #0

## 2017-11-13 MED FILL — CITALOPRAM HBR 20 MG TABLET: 20 | 30 days supply | Qty: 30 | Fill #4

## 2017-11-13 MED FILL — ALPRAZolam 0.5 MG TABS: 0.5 | 30 days supply | Qty: 90 | Fill #4

## 2017-11-28 MED FILL — ZOLPIDEM TARTRATE 10 MG TAB: 10 | 30 days supply | Qty: 30 | Fill #1

## 2017-11-28 MED FILL — BUPROPION HCL XL 300 MG TAB: 300 | 30 days supply | Qty: 30 | Fill #1

## 2017-11-28 MED FILL — ATENOLOL 50 MG TABLET: 50 | 30 days supply | Qty: 30 | Fill #1

## 2017-12-04 DIAGNOSIS — I10 Essential (primary) hypertension: Secondary | ICD-10-CM | POA: Diagnosis not present

## 2017-12-04 DIAGNOSIS — F418 Other specified anxiety disorders: Secondary | ICD-10-CM | POA: Diagnosis not present

## 2017-12-05 MED FILL — DESOGESTR-ETH ESTRAD ETH ES: 0.15-0.02/0 | 84 days supply | Qty: 84 | Fill #0

## 2017-12-05 MED FILL — PHENTERMINE 37.5 MG TABLET: 37.5 | 30 days supply | Qty: 30 | Fill #0

## 2017-12-10 MED FILL — CEPHALEXIN 500 MG CAPSULE: 500 | 10 days supply | Qty: 40 | Fill #0

## 2017-12-12 MED FILL — CITALOPRAM HBR 20 MG TABLET: 20 | 30 days supply | Qty: 30 | Fill #5

## 2017-12-12 MED FILL — ALPRAZolam 0.5 MG TABS: 0.5 | 30 days supply | Qty: 90 | Fill #5

## 2017-12-26 MED FILL — BUPROPION HCL XL 300 MG TAB: 300 | 30 days supply | Qty: 30 | Fill #2

## 2017-12-26 MED FILL — ZOLPIDEM TARTRATE 10 MG TAB: 10 | 30 days supply | Qty: 30 | Fill #2

## 2017-12-26 MED FILL — ATENOLOL 50 MG TABLET: 50 | 90 days supply | Qty: 90 | Fill #2

## 2018-01-02 MED FILL — PHENTERMINE 37.5 MG TABLET: 37.5 | 30 days supply | Qty: 30 | Fill #0

## 2018-01-13 MED FILL — CITALOPRAM HBR 20 MG TABLET: 20 | 30 days supply | Qty: 30 | Fill #0

## 2018-01-13 MED FILL — ALPRAZolam 0.5 MG TABS: 0.5 | 30 days supply | Qty: 90 | Fill #0

## 2018-01-26 MED FILL — ZOLPIDEM TARTRATE 10 MG TAB: 10 | 30 days supply | Qty: 30 | Fill #3

## 2018-01-26 MED FILL — BUPROPION HCL XL 300 MG TAB: 300 | 30 days supply | Qty: 30 | Fill #3

## 2018-02-02 MED FILL — PHENTERMINE 37.5 MG TABLET: 37.5 | 30 days supply | Qty: 30 | Fill #0

## 2018-02-11 MED FILL — ALPRAZolam 0.5 MG TABS: 0.5 | 30 days supply | Qty: 90 | Fill #1

## 2018-02-23 MED FILL — PROAIR HFA 90 MCG INHALER: 108 (90 BAS | 17 days supply | Qty: 9 | Fill #0

## 2018-02-24 ENCOUNTER — Ambulatory Visit (INDEPENDENT_AMBULATORY_CARE_PROVIDER_SITE_OTHER): Payer: Self-pay | Admitting: Family Medicine

## 2018-02-24 DIAGNOSIS — R05 Cough: Secondary | ICD-10-CM

## 2018-02-24 DIAGNOSIS — R062 Wheezing: Secondary | ICD-10-CM

## 2018-02-24 DIAGNOSIS — J40 Bronchitis, not specified as acute or chronic: Secondary | ICD-10-CM

## 2018-02-24 DIAGNOSIS — R059 Cough, unspecified: Secondary | ICD-10-CM

## 2018-02-24 MED ORDER — PREDNISONE 20 MG PO TABS: 60.0000 mg | ORAL_TABLET | Freq: Every day | ORAL | 0 refills | Status: AC

## 2018-02-24 MED ORDER — MONTELUKAST SODIUM 10 MG PO TABS: 10.0000 mg | ORAL_TABLET | Freq: Every day | ORAL | 0 refills | Status: AC

## 2018-02-24 MED FILL — predniSONE 20 MG TABS: 20 | 5 days supply | Qty: 15 | Fill #0

## 2018-02-24 MED FILL — MONTELUKAST SOD 10 MG TAB: 10 | 14 days supply | Qty: 14 | Fill #0

## 2018-02-24 NOTE — Patient Instructions (Addendum)
PLAN< Take medications as prescribed Will consider abx if symptoms if not improved  Acute Bronchitis, Adult Acute bronchitis is when air tubes (bronchi) in the lungs suddenly get swollen. The condition can make it hard to breathe. It can also cause these symptoms:  A cough.  Coughing up clear, yellow, or green mucus.  Wheezing.  Chest congestion.  Shortness of breath.  A fever.  Body aches.  Chills.  A sore throat.  Follow these instructions at home: Medicines  Take over-the-counter and prescription medicines only as told by your doctor.  If you were prescribed an antibiotic medicine, take it as told by your doctor. Do not stop taking the antibiotic even if you start to feel better. General instructions  Rest.  Drink enough fluids to keep your pee (urine) clear or pale yellow.  Avoid smoking and secondhand smoke. If you smoke and you need help quitting, ask your doctor. Quitting will help your lungs heal faster.  Use an inhaler, cool mist vaporizer, or humidifier as told by your doctor.  Keep all follow-up visits as told by your doctor. This is important. How is this prevented? To lower your risk of getting this condition again:  Wash your hands often with soap and water. If you cannot use soap and water, use hand sanitizer.  Avoid contact with people who have cold symptoms.  Try not to touch your hands to your mouth, nose, or eyes.  Make sure to get the flu shot every year.  Contact a doctor if:  Your symptoms do not get better in 2 weeks. Get help right away if:  You cough up blood.  You have chest pain.  You have very bad shortness of breath.  You become dehydrated.  You faint (pass out) or keep feeling like you are going to pass out.  You keep throwing up (vomiting).  You have a very bad headache.  Your fever or chills gets worse. This information is not intended to replace advice given to you by your health care provider. Make sure you  discuss any questions you have with your health care provider. Document Released: 03/11/2008 Document Revised: 05/01/2016 Document Reviewed: 03/13/2016 Elsevier Interactive Patient Education  [employer].

## 2018-02-24 NOTE — Progress Notes (Addendum)
Suzanne Black is a 32 y.o. female who presents today with concerns of allergies and cough and congestion x 5 days. She reports using OTC robitussin for the cough and zyrtec for allergy symptoms with some relief. She is concerned that the congestion is worsening. She did conduct an electronic visit was prescribed an inhaler which she also states has been beneficial.   Review of Systems  Constitutional: Negative for chills, fever and malaise/fatigue.  HENT: Negative for congestion, ear discharge, ear pain, sinus pain and sore throat.   Eyes: Negative.   Respiratory: Positive for cough, shortness of breath and wheezing. Negative for sputum production.   Cardiovascular: Negative.  Negative for chest pain.  Gastrointestinal: Negative for abdominal pain, diarrhea, nausea and vomiting.  Genitourinary: Negative for dysuria, frequency, hematuria and urgency.  Musculoskeletal: Negative for myalgias.  Skin: Negative.   Neurological: Negative for headaches.  Endo/Heme/Allergies: Negative.   Psychiatric/Behavioral: Negative.     O: Vitals:   02/24/18 0950  BP: 115/80  Pulse: 88  Resp: 16  Temp: (!) 97.5 F (36.4 C)  SpO2: 96%     Physical Exam  Constitutional: She is oriented to person, place, and time. Vital signs are normal. She appears well-developed and well-nourished. She is active.  Non-toxic appearance. She does not have a sickly appearance.  HENT:  Head: Normocephalic.  Right Ear: Hearing, external ear and ear canal normal. A middle ear effusion is present.  Left Ear: Hearing, external ear and ear canal normal. A middle ear effusion is present.  Nose: Rhinorrhea present.  Mouth/Throat: Uvula is midline and oropharynx is clear and moist.  Neck: Normal range of motion. Neck supple.  Cardiovascular: Normal rate, regular rhythm, normal heart sounds and normal pulses.  Pulmonary/Chest: Effort normal. She has wheezes in the right upper field, the right lower field, the left upper field  and the left lower field.  Abdominal: Soft. Bowel sounds are normal.  Musculoskeletal: Normal range of motion.  Lymphadenopathy:       Head (right side): No submental and no submandibular adenopathy present.       Head (left side): No submental and no submandibular adenopathy present.    She has no cervical adenopathy.  Neurological: She is alert and oriented to person, place, and time.  Psychiatric: She has a normal mood and affect.  Vitals reviewed.    A: 1. Cough   2. Bronchitis   3. Wheezing      P: Discussed diagnosis and continued use of Robitussin, increasing oral hydration and allergy regimen.  Will consider abx (Augmentin 875 mg BID x 10 days) if not improved. Exam findings, diagnosis etiology and medication use and indications reviewed with patient. Follow- Up and discharge instructions provided. No emergent/urgent issues found on exam.  Patient verbalized understanding of information provided and agrees with plan of care (POC), all questions answered.  1. Cough - montelukast (SINGULAIR) 10 MG tablet; Take 1 tablet (10 mg total) by mouth at bedtime for 14 days.  2. Bronchitis - predniSONE (DELTASONE) 20 MG tablet; Take 3 tablets (60 mg total) by mouth daily with breakfast for 5 days.  3. Wheezing - predniSONE (DELTASONE) 20 MG tablet; Take 3 tablets (60 mg total) by mouth daily with breakfast for 5 days.  Other orders - atenolol (TENORMIN) 50 MG tablet - PROAIR HFA 108 (90 Base) MCG/ACT inhaler - citalopram (CELEXA) 20 MG tablet

## 2018-02-26 ENCOUNTER — Other Ambulatory Visit: Payer: Self-pay

## 2018-02-26 ENCOUNTER — Telehealth: Payer: Self-pay

## 2018-02-26 MED ORDER — AMOXICILLIN-POT CLAVULANATE 875-125 MG PO TABS: 1.0000 | ORAL_TABLET | Freq: Two times a day (BID) | ORAL | 0 refills | Status: AC

## 2018-02-26 MED FILL — BUPROPION HCL XL 300 MG TAB: 300 | 30 days supply | Qty: 30 | Fill #4

## 2018-02-26 MED FILL — DESOGESTR-ETH ESTRAD ETH ES: 0.15-0.02/0 | 28 days supply | Qty: 28 | Fill #0

## 2018-02-26 MED FILL — CITALOPRAM HBR 20 MG TABLET: 20 | 30 days supply | Qty: 30 | Fill #1

## 2018-02-26 MED FILL — AMOX-CLAV 875-125 MG TABLET: 875-125 | 10 days supply | Qty: 20 | Fill #0

## 2018-02-26 MED FILL — ZOLPIDEM TARTRATE 10 MG TAB: 10 | 30 days supply | Qty: 30 | Fill #4

## 2018-02-26 NOTE — Progress Notes (Signed)
Patient called states she is not getting any better, provider put in her note to order Augmentin.

## 2018-03-10 MED FILL — PHENTERMINE 37.5 MG TABLET: 37.5 | 30 days supply | Qty: 30 | Fill #0

## 2018-03-11 MED FILL — ALPRAZolam 0.5 MG TABS: 0.5 | 30 days supply | Qty: 90 | Fill #2

## 2018-03-19 DIAGNOSIS — Z3009 Encounter for other general counseling and advice on contraception: Secondary | ICD-10-CM | POA: Diagnosis not present

## 2018-03-20 MED FILL — ATENOLOL 50 MG TABLET: 50 | 30 days supply | Qty: 30 | Fill #3

## 2018-03-20 MED FILL — DESOGESTR-ETH ESTRAD ETH ES: 0.15-0.02/0 | 84 days supply | Qty: 84 | Fill #0

## 2018-03-30 MED FILL — ZOLPIDEM TARTRATE 10 MG TAB: 10 | 30 days supply | Qty: 30 | Fill #5

## 2018-03-30 MED FILL — CITALOPRAM HBR 20 MG TABLET: 20 | 30 days supply | Qty: 30 | Fill #2

## 2018-03-30 MED FILL — BUPROPION HCL XL 300 MG TAB: 300 | 30 days supply | Qty: 30 | Fill #5

## 2018-04-08 MED FILL — ALPRAZolam 0.5 MG TABS: 0.5 | 30 days supply | Qty: 90 | Fill #3

## 2018-04-17 MED FILL — PHENTERMINE 37.5 MG TABLET: 37.5 | 30 days supply | Qty: 30 | Fill #0

## 2018-04-24 MED FILL — BUPROPION HCL XL 300 MG TAB: 300 | 30 days supply | Qty: 30 | Fill #0

## 2018-04-24 MED FILL — CITALOPRAM HBR 20 MG TABLET: 20 | 30 days supply | Qty: 30 | Fill #3

## 2018-04-24 MED FILL — ATENOLOL 50 MG TABLET: 50 | 90 days supply | Qty: 90 | Fill #0

## 2018-04-28 MED FILL — ZOLPIDEM TARTRATE 10 MG TAB: 10 | 30 days supply | Qty: 30 | Fill #0

## 2018-05-06 MED FILL — ALPRAZolam 0.5 MG TABS: 0.5 | 30 days supply | Qty: 90 | Fill #4

## 2018-05-27 ENCOUNTER — Ambulatory Visit (HOSPITAL_COMMUNITY)
Admission: EM | Admit: 2018-05-27 | Discharge: 2018-05-27 | Disposition: A | Discharge status: Home / Self Care | Payer: 59 | Attending: Family Medicine | Admitting: Family Medicine

## 2018-05-27 ENCOUNTER — Other Ambulatory Visit: Payer: Self-pay

## 2018-05-27 DIAGNOSIS — F329 Major depressive disorder, single episode, unspecified: Secondary | ICD-10-CM | POA: Diagnosis not present

## 2018-05-27 DIAGNOSIS — R109 Unspecified abdominal pain: Secondary | ICD-10-CM | POA: Diagnosis not present

## 2018-05-27 DIAGNOSIS — F419 Anxiety disorder, unspecified: Secondary | ICD-10-CM | POA: Diagnosis not present

## 2018-05-27 DIAGNOSIS — Z3202 Encounter for pregnancy test, result negative: Secondary | ICD-10-CM

## 2018-05-27 DIAGNOSIS — K210 Gastro-esophageal reflux disease with esophagitis: Secondary | ICD-10-CM | POA: Diagnosis not present

## 2018-05-27 DIAGNOSIS — F17210 Nicotine dependence, cigarettes, uncomplicated: Secondary | ICD-10-CM | POA: Diagnosis not present

## 2018-05-27 DIAGNOSIS — R102 Pelvic and perineal pain: Secondary | ICD-10-CM | POA: Diagnosis not present

## 2018-05-27 DIAGNOSIS — I10 Essential (primary) hypertension: Secondary | ICD-10-CM | POA: Diagnosis not present

## 2018-05-27 DIAGNOSIS — Z113 Encounter for screening for infections with a predominantly sexual mode of transmission: Secondary | ICD-10-CM | POA: Diagnosis not present

## 2018-05-27 DIAGNOSIS — Z8249 Family history of ischemic heart disease and other diseases of the circulatory system: Secondary | ICD-10-CM | POA: Diagnosis not present

## 2018-05-27 DIAGNOSIS — Z79899 Other long term (current) drug therapy: Secondary | ICD-10-CM | POA: Diagnosis not present

## 2018-05-27 DIAGNOSIS — M419 Scoliosis, unspecified: Secondary | ICD-10-CM | POA: Diagnosis not present

## 2018-05-27 DIAGNOSIS — N898 Other specified noninflammatory disorders of vagina: Secondary | ICD-10-CM | POA: Diagnosis not present

## 2018-05-27 LAB — POCT PREGNANCY, URINE: PREG TEST UR: NEGATIVE

## 2018-05-27 LAB — HCG, QUANTITATIVE, PREGNANCY: hCG, Beta Chain, Quant, S: <1 m[IU]/mL (ref <5)

## 2018-05-27 MED FILL — PHENTERMINE 37.5 MG TABLET: 37.5 | 30 days supply | Qty: 30 | Fill #0

## 2018-05-27 MED FILL — ZOLPIDEM TARTRATE 10 MG TAB: 10 | 30 days supply | Qty: 30 | Fill #1

## 2018-05-27 MED FILL — CITALOPRAM HBR 20 MG TABLET: 20 | 30 days supply | Qty: 30 | Fill #4

## 2018-05-27 MED FILL — BUPROPION HCL XL 300 MG TAB: 300 | 30 days supply | Qty: 30 | Fill #1

## 2018-05-27 NOTE — ED Triage Notes (Signed)
Pt stated her husband cheated on her.

## 2018-05-27 NOTE — ED Notes (Signed)
Urine specimen obtained and in lab 

## 2018-05-27 NOTE — ED Provider Notes (Signed)
[facility]   [id number] 05/27/18 Arrival Time: 1033   CC: CONCERN FOR STD  SUBJECTIVE:  Suzanne Black is a 32 y.o. female who presents requesting STI screening.  Last unprotected sexual encounter 1 week ago.  Sexually active with 1 female parnter.  Complains of associated lower abdominal discomfort.  States husband recently had an affair.  Husband complains of itching and rash.  Denies fever, chills, nausea, vomiting, pelvic pain, urinary symptoms, vaginal itching, vaginal odor, vaginal bleeding, dyspareunia, vaginal rashes or lesions.   Patient's last menstrual period was 04/20/2018.  ROS: As per HPI.  Past Medical History:  Diagnosis Date  . Anxiety   . Depression   . Essential hypertension, benign 11/2007  . H. pylori infection   . Hypertension   . Reflux esophagitis 09/2004  . Rosacea   . Scoliosis    No past surgical history on file. No Known Allergies No current facility-administered medications on file prior to encounter.    Current Outpatient Medications on File Prior to Encounter  Medication Sig Dispense Refill  . ALPRAZolam (XANAX) 0.25 MG tablet Take 1 tablet (0.25 mg total) by mouth 3 (three) times daily as needed for anxiety. Need office visit for additional refills. 90 tablet 0  . atenolol (TENORMIN) 50 MG tablet     . buPROPion (WELLBUTRIN XL) 150 MG 24 hr tablet Take 1 tablet (150 mg total) by mouth daily. (Patient not taking: Reported on 08/08/2017) 30 tablet 0  . citalopram (CELEXA) 20 MG tablet     . citalopram (CELEXA) 40 MG tablet TAKE 1 TABLET BY MOUTH ONCE DAILY (Patient not taking: Reported on 02/24/2018) 30 tablet 0  . desogestrel-ethinyl estradiol (KARIVA,AZURETTE,MIRCETTE) 0.15-0.02/0.01 MG (21/5) tablet Take 1 tablet by mouth daily. 3 Package 4  . labetalol (NORMODYNE) 200 MG tablet Take 1 tablet (200 mg total) by mouth daily. (Patient not taking: Reported on 08/08/2017) 90 tablet 4  . montelukast (SINGULAIR) 10 MG tablet Take 1 tablet (10 mg  total) by mouth at bedtime for 14 days. 14 tablet 0  . omeprazole (PRILOSEC) 40 MG capsule Take 40 mg by mouth daily as needed.    Marland Kitchen. omeprazole (PRILOSEC) 40 MG capsule TAKE 1 CAPSULE (40 MG TOTAL) BY MOUTH DAILY. (Patient not taking: Reported on 08/08/2017) 30 capsule 1  . PROAIR HFA 108 (90 Base) MCG/ACT inhaler     . zolpidem (AMBIEN) 10 MG tablet Take 1 tablet (10 mg total) by mouth at bedtime as needed for sleep. Need office visit for additional refills. (Patient not taking: Reported on 08/08/2017) 30 tablet 0   Social History   Socioeconomic History  . Marital status: Legally Separated    Spouse name: Jomarie LongsJoseph  . Number of children: 1  . Years of education: 6612  . Highest education level: Not on file  Occupational History  . Occupation: Chief Executive Officerharmacy tech    Employer: [employer]  Social Needs  . Financial resource strain: Not on file  . Food insecurity:    Worry: Not on file    Inability: Not on file  . Transportation needs:    Medical: Not on file    Non-medical: Not on file  Tobacco Use  . Smoking status: Current Every Day Smoker    Packs/day: 0.30    Types: Cigarettes  . Smokeless tobacco: Never Used  . Tobacco comment: trying to quit; stress relief; husband smokes  Substance and Sexual Activity  . Alcohol use: No  . Drug use: No  . Sexual activity: Yes  Partners: Male  Lifestyle  . Physical activity:    Days per week: Not on file    Minutes per session: Not on file  . Stress: Not on file  Relationships  . Social connections:    Talks on phone: Not on file    Gets together: Not on file    Attends religious service: Not on file    Active member of club or organization: Not on file    Attends meetings of clubs or organizations: Not on file    Relationship status: Not on file  . Intimate partner violence:    Fear of current or ex partner: Not on file    Emotionally abused: Not on file    Physically abused: Not on file    Forced sexual activity: Not on file  Other  Topics Concern  . Not on file  Social History Narrative   Husband is working out of town.   Family History  Problem Relation Age of Onset  . Hypertension Father   . Hyperlipidemia Father   . Mental illness Maternal Uncle   . Mental illness Mother        anxiety  . Cancer Paternal Grandfather        lung cancer with bone metastasis  . Alzheimer's disease Paternal Grandfather     OBJECTIVE:  Vitals:   05/27/18 1059 05/27/18 1103  BP: (!) 132/91   Pulse: 71   Resp: 16   Temp: 98.5 F (36.9 C)   SpO2: 98%   Weight:  180 lb (81.6 kg)     General appearance: alert, cooperative, appears stated age and no distress Throat: lips, mucosa, and tongue normal; teeth and gums normal Lungs: CTA bilaterally without adventitious breath sounds Heart: regular rate and rhythm.  Radial pulses 2+ symmetrical bilaterally Abdomen: soft, non-tender; bowel sounds normal; no masses or organomegaly; no guarding GU:  external examination without vulvar lesions or erythema Bimanual exam: Negative for cervical motion tenderness, mild left sided adnexal tenderness; Speculum exam: Thick/thin white discharge visualized during speculum exam.  Cervix visualized without erythema or lesions. Cervical swab obtained Skin: warm and dry Psychological:  Alert and cooperative. Normal mood and affect.  LABS:   Results for orders placed or performed during the hospital encounter of 05/27/18 (from the past 24 hour(s))  Pregnancy, urine POC     Status: None   Collection Time: 05/27/18 11:46 AM  Result Value Ref Range   Preg Test, Ur NEGATIVE NEGATIVE   ASSESSMENT & PLAN:  1. Screening examination for STD (sexually transmitted disease)   2. Vaginal discharge   3. Left adnexal tenderness     No orders of the defined types were placed in this encounter.   Pending: Labs Reviewed  HCG, QUANTITATIVE, PREGNANCY  HIV ANTIBODY (ROUTINE TESTING)  RPR  POCT PREGNANCY, URINE  CERVICOVAGINAL ANCILLARY ONLY   UPT  negative.  Beta HCG ordered.  I will call you if positive results today.   Cervical swab obtained Blood work for HIV and syphilis ordered Declines STD treatment today.  Would like to wait on the results We will follow up with you regarding the results of your test If tests are positive, please abstain from sexual activity for at least 7 days and notify partners Return here or go to ER if you have any new or worsening symptoms    Reviewed expectations re: course of current medical issues. Questions answered. Outlined signs and symptoms indicating need for more acute intervention. Patient verbalized understanding. After  Visit Summary given.       Rennis Harding, PA-C 05/27/18 1215

## 2018-05-27 NOTE — ED Notes (Signed)
Bed: UC01 Expected date:  Expected time:  Means of arrival:  Comments: 

## 2018-05-27 NOTE — Discharge Instructions (Addendum)
UPT negative.  Beta HCG ordered.  I will call you if positive results today.   Cervical swab obtained Blood work for HIV and syphilis ordered Declines STD treatment today.  Would like to wait on the results We will follow up with you regarding the results of your test If tests are positive, please abstain from sexual activity for at least 7 days and notify partners Return here or go to ER if you have any new or worsening symptoms

## 2018-05-28 LAB — CERVICOVAGINAL ANCILLARY ONLY
BACTERIAL VAGINITIS: NEGATIVE
Candida vaginitis: POSITIVE — AB
Chlamydia: NEGATIVE
NEISSERIA GONORRHEA: NEGATIVE
TRICH (WINDOWPATH): NEGATIVE

## 2018-05-28 LAB — RPR: RPR: NONREACTIVE

## 2018-05-28 LAB — HIV ANTIBODY (ROUTINE TESTING W REFLEX): HIV SCREEN 4TH GENERATION: NONREACTIVE

## 2018-05-29 ENCOUNTER — Telehealth (HOSPITAL_COMMUNITY): Payer: Self-pay

## 2018-05-29 ENCOUNTER — Encounter: Payer: Self-pay | Admitting: Family Medicine

## 2018-05-29 MED ORDER — FLUCONAZOLE 150 MG PO TABS: 150.0000 mg | ORAL_TABLET | Freq: Every day | ORAL | 0 refills | Status: AC

## 2018-05-29 MED FILL — FLUCONAZOLE 150 MG TABS: 150 | 7 days supply | Qty: 2 | Fill #0

## 2018-05-29 NOTE — Telephone Encounter (Signed)
Pt contacted regarding test for candida (yeast) was positive.  Prescription for fluconazole 150mg po now, repeat dose in 3d if needed, #2 no refills, sent to the pharmacy of record.  Recheck or followup with PCP for further evaluation if symptoms are not improving.  Answered all questions.  

## 2018-06-03 MED FILL — ALPRAZolam 0.5 MG TABS: 0.5 | 30 days supply | Qty: 90 | Fill #5

## 2018-06-03 MED FILL — DESOGESTR-ETH ESTRAD ETH ES: 0.15-0.02/0 | 84 days supply | Qty: 84 | Fill #1

## 2018-06-25 MED FILL — ZOLPIDEM TARTRATE 10 MG TAB: 10 | 30 days supply | Qty: 30 | Fill #2

## 2018-06-25 MED FILL — CITALOPRAM HBR 20 MG TABLET: 20 | 30 days supply | Qty: 30 | Fill #5

## 2018-06-30 MED FILL — FLUCONAZOLE 150 MG TABS: 150 | 2 days supply | Qty: 2 | Fill #0

## 2018-06-30 MED FILL — BuPROPion HCL ER (XL) 300 M: 300 | 30 days supply | Qty: 30 | Fill #2

## 2018-07-01 MED FILL — ALPRAZolam 0.5 MG TABS: 0.5 | 30 days supply | Qty: 90 | Fill #0

## 2018-07-03 MED FILL — CEPHALEXIN 500 MG CAPSULE: 500 | 10 days supply | Qty: 30 | Fill #0

## 2018-07-21 MED FILL — CITALOPRAM HBR 20 MG TABLET: 20 | 30 days supply | Qty: 30 | Fill #0

## 2018-07-21 MED FILL — ATENOLOL 50 MG TABLET: 50 | 90 days supply | Qty: 90 | Fill #1

## 2018-07-24 MED FILL — ZOLPIDEM TARTRATE 10 MG TAB: 10 | 30 days supply | Qty: 30 | Fill #3

## 2018-07-24 MED FILL — BuPROPion HCL ER (XL) 300 M: 300 | 30 days supply | Qty: 30 | Fill #3

## 2018-07-30 MED FILL — ALPRAZolam 0.5 MG TABS: 0.5 | 30 days supply | Qty: 90 | Fill #1

## 2018-08-11 MED FILL — METHYLPREDNISOLONE 4 MG TAB: 4 | 6 days supply | Qty: 21 | Fill #0

## 2018-08-21 MED FILL — ZOLPIDEM TARTRATE 10 MG TAB: 10 | 30 days supply | Qty: 30 | Fill #4

## 2018-08-25 MED FILL — CITALOPRAM HBR 20 MG TABLET: 20 | 30 days supply | Qty: 30 | Fill #1

## 2018-08-27 MED FILL — buPROPion HCL ER (XL) 300 M: 300 | 30 days supply | Qty: 30 | Fill #4

## 2018-08-27 MED FILL — DESOGESTR-ETH ESTRAD ETH ES: 0.15-0.02/0 | 56 days supply | Qty: 56 | Fill #2

## 2018-08-27 MED FILL — ALPRAZolam 0.5 MG TABS: 0.5 | 30 days supply | Qty: 90 | Fill #2

## 2018-09-08 MED FILL — PHENTERMINE 37.5 MG TABLET: 37.5 | 30 days supply | Qty: 30 | Fill #0

## 2018-09-22 MED FILL — ZOLPIDEM TARTRATE 10 MG TAB: 10 | 30 days supply | Qty: 30 | Fill #5

## 2018-09-24 MED FILL — ALPRAZolam 0.5 MG TABS: 0.5 | 30 days supply | Qty: 90 | Fill #3

## 2018-09-24 MED FILL — CITALOPRAM HBR 20 MG TABLET: 20 | 30 days supply | Qty: 30 | Fill #2

## 2018-09-24 MED FILL — buPROPion HCL ER (XL) 300 M: 300 | 30 days supply | Qty: 30 | Fill #5

## 2018-09-30 ENCOUNTER — Telehealth: Payer: 59 | Admitting: Family

## 2018-09-30 DIAGNOSIS — F17200 Nicotine dependence, unspecified, uncomplicated: Secondary | ICD-10-CM

## 2018-09-30 DIAGNOSIS — B9689 Other specified bacterial agents as the cause of diseases classified elsewhere: Secondary | ICD-10-CM

## 2018-09-30 DIAGNOSIS — J208 Acute bronchitis due to other specified organisms: Secondary | ICD-10-CM

## 2018-09-30 MED ORDER — AZITHROMYCIN 250 MG PO TABS: ORAL_TABLET | ORAL | 0 refills | Status: AC

## 2018-09-30 MED ORDER — BENZONATATE 100 MG PO CAPS: 100.0000 mg | ORAL_CAPSULE | Freq: Three times a day (TID) | ORAL | 0 refills | Status: AC | PRN

## 2018-09-30 MED ORDER — PREDNISONE 10 MG (21) PO TBPK: ORAL_TABLET | ORAL | 0 refills | Status: AC

## 2018-09-30 NOTE — Progress Notes (Signed)
We are sorry that you are not feeling well.  Here is how we plan to help!  Based on your presentation I believe you most likely have A cough due to bacteria.  When patients have a fever and a productive cough with a change in color or increased sputum production, we are concerned about bacterial bronchitis.  If left untreated it can progress to pneumonia.  If your symptoms do not improve with your treatment plan it is important that you contact your provider.   I have prescribed Azithromyin 250 mg: two tablets now and then one tablet daily for 4 additonal days    In addition you may use A non-prescription cough medication called Robitussin DAC. Take 2 teaspoons every 8 hours or Delsym: take 2 teaspoons every 12 hours., A non-prescription cough medication called Mucinex DM: take 2 tablets every 12 hours. and A prescription cough medication called Tessalon Perles 100mg. You may take 1-2 capsules every 8 hours as needed for your cough.  Prednisone 10 mg daily for 6 days (see taper instructions below)  Directions for 6 day taper: Day 1: 2 tablets before breakfast, 1 after both lunch & dinner and 2 at bedtime Day 2: 1 tab before breakfast, 1 after both lunch & dinner and 2 at bedtime Day 3: 1 tab at each meal & 1 at bedtime Day 4: 1 tab at breakfast, 1 at lunch, 1 at bedtime Day 5: 1 tab at breakfast & 1 tab at bedtime Day 6: 1 tab at breakfast   From your responses in the eVisit questionnaire you describe inflammation in the upper respiratory tract which is causing a significant cough.  This is commonly called Bronchitis and has four common causes:    Allergies  Viral Infections  Acid Reflux  Bacterial Infection Allergies, viruses and acid reflux are treated by controlling symptoms or eliminating the cause. An example might be a cough caused by taking certain blood pressure medications. You stop the cough by changing the medication. Another example might be a cough caused by acid reflux.  Controlling the reflux helps control the cough.  USE OF BRONCHODILATOR ("RESCUE") INHALERS: There is a risk from using your bronchodilator too frequently.  The risk is that over-reliance on a medication which only relaxes the muscles surrounding the breathing tubes can reduce the effectiveness of medications prescribed to reduce swelling and congestion of the tubes themselves.  Although you feel brief relief from the bronchodilator inhaler, your asthma may actually be worsening with the tubes becoming more swollen and filled with mucus.  This can delay other crucial treatments, such as oral steroid medications. If you need to use a bronchodilator inhaler daily, several times per day, you should discuss this with your provider.  There are probably better treatments that could be used to keep your asthma under control.     HOME CARE . Only take medications as instructed by your medical team. . Complete the entire course of an antibiotic. . Drink plenty of fluids and get plenty of rest. . Avoid close contacts especially the very young and the elderly . Cover your mouth if you cough or cough into your sleeve. . Always remember to wash your hands . A steam or ultrasonic humidifier can help congestion.   GET HELP RIGHT AWAY IF: . You develop worsening fever. . You become short of breath . You cough up blood. . Your symptoms persist after you have completed your treatment plan MAKE SURE YOU   Understand these instructions.    Will watch your condition.  Will get help right away if you are not doing well or get worse.  Your e-visit answers were reviewed by a board certified advanced clinical practitioner to complete your personal care plan.  Depending on the condition, your plan could have included both over the counter or prescription medications. If there is a problem please reply  once you have received a response from your provider. Your safety is important to us.  If you have drug allergies  check your prescription carefully.    You can use MyChart to ask questions about today's visit, request a non-urgent call back, or ask for a work or school excuse for 24 hours related to this e-Visit. If it has been greater than 24 hours you will need to follow up with your provider, or enter a new e-Visit to address those concerns. You will get an e-mail in the next two days asking about your experience.  I hope that your e-visit has been valuable and will speed your recovery. Thank you for using e-visits.   

## 2018-10-01 MED FILL — AZITHROMYCIN 250 MG TABLET: 250 | 5 days supply | Qty: 6 | Fill #0

## 2018-10-01 MED FILL — predniSONE 10 MG TABS: 10 | 6 days supply | Qty: 21 | Fill #0

## 2018-10-01 MED FILL — BENZONATATE 100 MG CAPS: 100 | 6 days supply | Qty: 20 | Fill #0

## 2018-10-08 ENCOUNTER — Telehealth: Payer: 59 | Admitting: Family Medicine

## 2018-10-08 ENCOUNTER — Encounter: Payer: Self-pay | Admitting: Family Medicine

## 2018-10-08 DIAGNOSIS — T3695XA Adverse effect of unspecified systemic antibiotic, initial encounter: Secondary | ICD-10-CM

## 2018-10-08 DIAGNOSIS — B379 Candidiasis, unspecified: Secondary | ICD-10-CM | POA: Diagnosis not present

## 2018-10-08 MED ORDER — FLUCONAZOLE 150 MG PO TABS: 150.0000 mg | ORAL_TABLET | Freq: Once | ORAL | 0 refills | Status: AC

## 2018-10-08 MED FILL — FLUCONAZOLE 150 MG TABS: 150 | 1 days supply | Qty: 1 | Fill #0

## 2018-10-08 NOTE — Progress Notes (Signed)
We are sorry that you are not feeling well. Here is how we plan to help! Based on what you shared with me it looks like you: May have a yeast vaginosis  Vaginosis is an inflammation of the vagina that can result in discharge, itching and pain. The cause is usually a change in the normal balance of vaginal bacteria or an infection. Vaginosis can also result from reduced estrogen levels after menopause.  The most common causes of vaginosis are:   Bacterial vaginosis which results from an overgrowth of one on several organisms that are normally present in your vagina.   Yeast infections which are caused by a naturally occurring fungus called candida.   Vaginal atrophy (atrophic vaginosis) which results from the thinning of the vagina from reduced estrogen levels after menopause.   Trichomoniasis which is caused by a parasite and is commonly transmitted by sexual intercourse.  Factors that increase your risk of developing vaginosis include: Marland Kitchen Medications, such as antibiotics and steroids . Uncontrolled diabetes . Use of hygiene products such as bubble bath, vaginal spray or vaginal deodorant . Douching . Wearing damp or tight-fitting clothing . Using an intrauterine device (IUD) for birth control . Hormonal changes, such as those associated with pregnancy, birth control pills or menopause . Sexual activity . Having a sexually transmitted infection  Your treatment plan is A single Diflucan (fluconazole) 150mg  tablet once.  I have electronically sent this prescription into the pharmacy that you have chosen. I am only able to send one tablet.  Be sure to take all of the medication as directed. Stop taking any medication if you develop a rash, tongue swelling or shortness of breath. Mothers who are breast feeding should consider pumping and discarding their breast milk while on these antibiotics. However, there is no consensus that infant exposure at these doses would be harmful.  Remember that  medication creams can weaken latex condoms. Marland Kitchen   HOME CARE:  Good hygiene may prevent some types of vaginosis from recurring and may relieve some symptoms:  . Avoid baths, hot tubs and whirlpool spas. Rinse soap from your outer genital area after a shower, and dry the area well to prevent irritation. Don't use scented or harsh soaps, such as those with deodorant or antibacterial action. Marland Kitchen Avoid irritants. These include scented tampons and pads. . Wipe from front to back after using the toilet. Doing so avoids spreading fecal bacteria to your vagina.  Other things that may help prevent vaginosis include:  Marland Kitchen Don't douche. Your vagina doesn't require cleansing other than normal bathing. Repetitive douching disrupts the normal organisms that reside in the vagina and can actually increase your risk of vaginal infection. Douching won't clear up a vaginal infection. . Use a latex condom. Both female and female latex condoms may help you avoid infections spread by sexual contact. . Wear cotton underwear. Also wear pantyhose with a cotton crotch. If you feel comfortable without it, skip wearing underwear to bed. Yeast thrives in [employer] Your symptoms should improve in the next day or two.  GET HELP RIGHT AWAY IF:  . You have pain in your lower abdomen ( pelvic area or over your ovaries) . You develop nausea or vomiting . You develop a fever . Your discharge changes or worsens . You have persistent pain with intercourse . You develop shortness of breath, a rapid pulse, or you faint.  These symptoms could be signs of problems or infections that need to be evaluated by a medical  provider now.  MAKE SURE YOU    Understand these instructions.  Will watch your condition.  Will get help right away if you are not doing well or get worse.  Your e-visit answers were reviewed by a board certified advanced clinical practitioner to complete your personal care plan. Depending upon the  condition, your plan could have included both over the counter or prescription medications. Please review your pharmacy choice to make sure that you have choses a pharmacy that is open for you to pick up any needed prescription, Your safety is important to [place]. If you have drug allergies check your prescription carefully.   You can use MyChart to ask questions about today's visit, request a non-urgent call back, or ask for a work or school excuse for 24 hours related to this e-Visit. If it has been greater than 24 hours you will need to follow up with your provider, or enter a new e-Visit to address those concerns. You will get a MyChart message within the next two days asking about your experience. I hope that your e-visit has been valuable and will speed your recovery.

## 2018-10-12 MED FILL — metroNIDAZOLE 500 MG TABS: 500 | 7 days supply | Qty: 14 | Fill #0

## 2018-10-13 MED FILL — DESOGESTR-ETH ESTRAD ETH ES: 0.15-0.02/0 | 84 days supply | Qty: 84 | Fill #0

## 2018-10-15 MED FILL — PHENTERMINE 37.5 MG TABLET: 37.5 | 30 days supply | Qty: 30 | Fill #0

## 2018-10-19 MED FILL — ATENOLOL 50 MG TABLET: 50 | 90 days supply | Qty: 90 | Fill #0

## 2018-10-19 MED FILL — buPROPion HCL ER (XL) 300 M: 300 | 30 days supply | Qty: 30 | Fill #0

## 2018-10-20 MED FILL — ZOLPIDEM TARTRATE 10 MG TAB: 10 | 30 days supply | Qty: 30 | Fill #0

## 2018-10-22 MED FILL — CITALOPRAM HBR 20 MG TABLET: 20 | 30 days supply | Qty: 30 | Fill #3

## 2018-10-22 MED FILL — ALPRAZolam 0.5 MG TABS: 0.5 | 30 days supply | Qty: 90 | Fill #4

## 2018-11-05 DIAGNOSIS — H52223 Regular astigmatism, bilateral: Secondary | ICD-10-CM | POA: Diagnosis not present

## 2018-11-05 DIAGNOSIS — H5213 Myopia, bilateral: Secondary | ICD-10-CM | POA: Diagnosis not present

## 2018-11-19 MED FILL — ALPRAZolam 0.5 MG TABS: 0.5 | 30 days supply | Qty: 90 | Fill #5

## 2018-11-19 MED FILL — buPROPion HCL ER (XL) 300 M: 300 | 90 days supply | Qty: 90 | Fill #1 | Status: TO

## 2018-11-19 MED FILL — CITALOPRAM HBR 20 MG TABLET: 20 | 60 days supply | Qty: 60 | Fill #4 | Status: TO

## 2018-11-19 MED FILL — ZOLPIDEM TARTRATE 10 MG TAB: 10 | 30 days supply | Qty: 30 | Fill #1

## 2018-11-29 ENCOUNTER — Encounter: Payer: Self-pay | Admitting: Family Medicine

## 2018-11-29 ENCOUNTER — Ambulatory Visit (INDEPENDENT_AMBULATORY_CARE_PROVIDER_SITE_OTHER): Payer: Self-pay | Admitting: Family Medicine

## 2018-11-29 VITALS — BP 122/84 | HR 110 | Temp 98.9°F | Wt 191.2 lb

## 2018-11-29 DIAGNOSIS — R0981 Nasal congestion: Secondary | ICD-10-CM

## 2018-11-29 DIAGNOSIS — J101 Influenza due to other identified influenza virus with other respiratory manifestations: Secondary | ICD-10-CM

## 2018-11-29 DIAGNOSIS — R110 Nausea: Secondary | ICD-10-CM

## 2018-11-29 DIAGNOSIS — R52 Pain, unspecified: Secondary | ICD-10-CM

## 2018-11-29 LAB — POCT INFLUENZA A/B
INFLUENZA A, POC: NEGATIVE
Influenza B, POC: POSITIVE — AB

## 2018-11-29 MED ORDER — OSELTAMIVIR PHOSPHATE 75 MG PO CAPS: 75.0000 mg | ORAL_CAPSULE | Freq: Two times a day (BID) | ORAL | 0 refills | Status: AC

## 2018-11-29 MED ORDER — AZELASTINE HCL 0.1 % NA SOLN: 1.0000 | Freq: Two times a day (BID) | NASAL | 0 refills | Status: AC

## 2018-11-29 MED ORDER — ONDANSETRON 4 MG PO TBDP: 4.0000 mg | ORAL_TABLET | Freq: Three times a day (TID) | ORAL | 0 refills | Status: AC | PRN

## 2018-11-29 NOTE — Patient Instructions (Signed)

## 2018-11-29 NOTE — Progress Notes (Signed)
Suzanne Black is a 33 y.o. female who presents today with 2 days of flu like symptoms. She is present with her young son who has similar symptoms. She has been treating the symptoms with tylenol with some evidence of relief of symptoms. She reports that she is overall healthy and denies known sick social or occupational contacts. She is a smoker. See PMH/SMH below.  Review of Systems  Constitutional: Positive for chills, fever and malaise/fatigue.  HENT: Positive for congestion and sore throat. Negative for ear discharge, ear pain and sinus pain.   Eyes: Negative.  Negative for discharge and redness.  Respiratory: Negative for cough, sputum production and shortness of breath.   Cardiovascular: Negative.  Negative for chest pain.  Gastrointestinal: Positive for nausea. Negative for abdominal pain, diarrhea and vomiting.  Genitourinary: Negative for dysuria, frequency, hematuria and urgency.  Musculoskeletal: Negative for myalgias.  Skin: Negative.  Negative for itching and rash.  Neurological: Negative for dizziness and headaches.  Endo/Heme/Allergies: Negative.   Psychiatric/Behavioral: Negative.     Suzanne Black has a current medication list which includes the following prescription(s): alprazolam, atenolol, azithromycin, bupropion, citalopram, citalopram, desogestrel-ethinyl estradiol, zolpidem, azelastine, benzonatate, labetalol, montelukast, omeprazole, omeprazole, ondansetron, oseltamivir, prednisone, and proair hfa. Also has No Known Allergies.  Suzanne Black  has a past medical history of Anxiety, Depression, Essential hypertension, benign (11/2007), H. pylori infection, Hypertension, Reflux esophagitis (09/2004), Rosacea, and Scoliosis. Also  has no past surgical history on file.    O: Vitals:   11/29/18 1441  BP: 122/84  Pulse: (!) 110  Temp: 98.9 F (37.2 C)  SpO2: 95%    Physical Exam Vitals signs (tachycardic) reviewed.  Constitutional:      General: She is not in acute distress.    Appearance: Normal appearance. She is well-developed and normal weight. She is not ill-appearing, toxic-appearing or diaphoretic.  HENT:     Head: Normocephalic.     Right Ear: Hearing, tympanic membrane, ear canal and external ear normal. No middle ear effusion. Tympanic membrane is not injected or erythematous.     Left Ear: Hearing, tympanic membrane, ear canal and external ear normal.  No middle ear effusion. Tympanic membrane is not injected or erythematous.     Nose: Congestion and rhinorrhea present.     Right Sinus: No maxillary sinus tenderness or frontal sinus tenderness.     Left Sinus: No maxillary sinus tenderness or frontal sinus tenderness.     Mouth/Throat:     Lips: Pink.     Mouth: Mucous membranes are moist.     Pharynx: Uvula midline. No pharyngeal swelling, oropharyngeal exudate, posterior oropharyngeal erythema or uvula swelling.     Tonsils: No tonsillar exudate or tonsillar abscesses.  Eyes:     Pupils: Pupils are equal, round, and reactive to light.  Neck:     Musculoskeletal: Normal range of motion and neck supple.  Cardiovascular:     Rate and Rhythm: Regular rhythm. Tachycardia present.     Pulses: Normal pulses.     Heart sounds: Normal heart sounds.     Comments: Repeat HR by provider only improved to 106 BPM Pulmonary:     Effort: Pulmonary effort is normal.     Breath sounds: Normal breath sounds.  Abdominal:     General: Bowel sounds are normal.     Palpations: Abdomen is soft.  Musculoskeletal: Normal range of motion.  Lymphadenopathy:     Head:     Right side of head: No submental or submandibular adenopathy.  Left side of head: No submental or submandibular adenopathy.     Cervical: No cervical adenopathy.  Skin:    General: Skin is warm.  Neurological:     Mental Status: She is alert and oriented to person, place, and time.  Psychiatric:        Mood and Affect: Mood normal.        Behavior: Behavior is cooperative.    A: 1.  Influenza B   2. Body aches   3. Nausea   4. Nasal congestion    P: Treated with antiviral at patient request- medication risks and benefits discussed. She is pending starting a new job tomorrow and not will be delayed by 2 weeks. She was provided a work note x 72 hours. She is a Occupational psychologist.  1. Influenza B - oseltamivir (TAMIFLU) 75 MG capsule; Take 1 capsule (75 mg total) by mouth 2 (two) times daily for 5 days.  Overall well appearing, NAD, mild exam findings. Discussed natural history of the disease and treatment options; including supportive care and antiviral therapy. No evidence of high risk for factors for serious influenza complications observed. Encouraged rest, hydration, and to continue OTC tylenol or ibuprofen as prescribed for fever. Educated on proper Special educational needs teacher. Recommended wearing a mask daily especially around other people. Remain out of school/work until afebrile for 24 hours w/o use of antipyretics. Educated on potential complications of the flu. Advised to follow up in clinic or with PCP if she develops any of these concerning symptoms or if current symptoms persist outside of discussed boundaries.  2. Body aches - POCT Influenza A/B Results for orders placed or performed in visit on 11/29/18 (from the past 24 hour(s))  POCT Influenza A/B     Status: Abnormal   Collection Time: 11/29/18  2:59 PM  Result Value Ref Range   Influenza A, POC Negative Negative   Influenza B, POC Positive (A) Negative    3. Nausea - ondansetron (ZOFRAN ODT) 4 MG disintegrating tablet; Take 1 tablet (4 mg total) by mouth every 8 (eight) hours as needed for nausea or vomiting.  4. Nasal congestion - azelastine (ASTELIN) 0.1 % nasal spray; Place 1 spray into both nostrils 2 (two) times daily. Use in each nostril as directed   Discussed with patient exam findings, suspected diagnosis etiology and  reviewed recommended treatment plan and follow up, including complications and  indications for urgent medical follow up and evaluation. Medications including use and indications reviewed with patient. Patient provided relevant patient education on diagnosis and/or relevant related condition that were discussed and reviewed with patient at discharge. Patient verbalized understanding of information provided and agrees with plan of care (POC), all questions answered.

## 2018-12-01 ENCOUNTER — Telehealth: Payer: Self-pay

## 2018-12-01 ENCOUNTER — Encounter: Payer: Self-pay | Admitting: Family Medicine

## 2018-12-01 NOTE — Telephone Encounter (Signed)
Called and lm on pt vm regarding her visit with [place].

## 2018-12-03 ENCOUNTER — Encounter: Payer: Self-pay | Admitting: Family Medicine

## 2018-12-17 MED FILL — ALPRAZolam 0.5 MG TABS: 0.5 | 30 days supply | Qty: 90 | Fill #0 | Status: TO

## 2018-12-17 MED FILL — ZOLPIDEM TARTRATE 10 MG TAB: 10 | 30 days supply | Qty: 30 | Fill #2 | Status: TO

## 2019-11-16 NOTE — Telephone Encounter (Signed)
Error  Error
